# Patient Record
Sex: Female | Born: 1985 | Hispanic: No | Marital: Single | Smoking: Former smoker
Health system: Southern US, Community
[De-identification: ages and names within clinical notes are randomized; demographics above are authoritative.]

## PROBLEM LIST (undated history)

## (undated) DIAGNOSIS — K219 Gastro-esophageal reflux disease without esophagitis: Secondary | ICD-10-CM

## (undated) DIAGNOSIS — R519 Headache, unspecified: Secondary | ICD-10-CM

## (undated) DIAGNOSIS — A749 Chlamydial infection, unspecified: Secondary | ICD-10-CM

## (undated) DIAGNOSIS — F909 Attention-deficit hyperactivity disorder, unspecified type: Secondary | ICD-10-CM

## (undated) DIAGNOSIS — N939 Abnormal uterine and vaginal bleeding, unspecified: Secondary | ICD-10-CM

## (undated) DIAGNOSIS — IMO0002 Reserved for concepts with insufficient information to code with codable children: Secondary | ICD-10-CM

## (undated) DIAGNOSIS — J189 Pneumonia, unspecified organism: Secondary | ICD-10-CM

## (undated) DIAGNOSIS — F419 Anxiety disorder, unspecified: Secondary | ICD-10-CM

## (undated) DIAGNOSIS — R87619 Unspecified abnormal cytological findings in specimens from cervix uteri: Secondary | ICD-10-CM

## (undated) DIAGNOSIS — F32A Depression, unspecified: Secondary | ICD-10-CM

## (undated) HISTORY — DX: Abnormal uterine and vaginal bleeding, unspecified: N93.9

## (undated) HISTORY — DX: Reserved for concepts with insufficient information to code with codable children: IMO0002

## (undated) HISTORY — DX: Chlamydial infection, unspecified: A74.9

## (undated) HISTORY — DX: Unspecified abnormal cytological findings in specimens from cervix uteri: R87.619

## (undated) HISTORY — PX: VSD REPAIR: SHX276

---

## 2005-08-14 ENCOUNTER — Ambulatory Visit: Payer: Self-pay | Admitting: Gynecology

## 2005-11-26 ENCOUNTER — Ambulatory Visit: Payer: Self-pay | Admitting: Obstetrics & Gynecology

## 2005-12-04 ENCOUNTER — Ambulatory Visit: Payer: Self-pay | Admitting: Family Medicine

## 2005-12-22 ENCOUNTER — Encounter (INDEPENDENT_AMBULATORY_CARE_PROVIDER_SITE_OTHER): Payer: Self-pay | Admitting: *Deleted

## 2005-12-22 ENCOUNTER — Ambulatory Visit: Payer: Self-pay | Admitting: Obstetrics & Gynecology

## 2005-12-22 ENCOUNTER — Ambulatory Visit: Payer: Self-pay | Admitting: Family Medicine

## 2006-01-13 ENCOUNTER — Ambulatory Visit: Payer: Self-pay | Admitting: Gynecology

## 2006-01-19 ENCOUNTER — Ambulatory Visit: Payer: Self-pay | Admitting: Family Medicine

## 2006-02-09 ENCOUNTER — Ambulatory Visit: Payer: Self-pay | Admitting: Family Medicine

## 2006-02-16 ENCOUNTER — Ambulatory Visit: Payer: Self-pay | Admitting: Obstetrics & Gynecology

## 2006-02-24 ENCOUNTER — Ambulatory Visit (HOSPITAL_COMMUNITY): Admission: RE | Admit: 2006-02-24 | Discharge: 2006-02-24 | Payer: Self-pay | Admitting: Gynecology

## 2006-03-02 ENCOUNTER — Ambulatory Visit (HOSPITAL_COMMUNITY): Admission: RE | Admit: 2006-03-02 | Discharge: 2006-03-02 | Payer: Self-pay | Admitting: Gynecology

## 2006-03-22 ENCOUNTER — Ambulatory Visit: Payer: Self-pay | Admitting: Gynecology

## 2006-04-28 ENCOUNTER — Ambulatory Visit: Payer: Self-pay | Admitting: Obstetrics & Gynecology

## 2006-05-12 ENCOUNTER — Ambulatory Visit: Payer: Self-pay | Admitting: Obstetrics & Gynecology

## 2006-06-09 ENCOUNTER — Ambulatory Visit: Payer: Self-pay | Admitting: Obstetrics & Gynecology

## 2006-06-12 ENCOUNTER — Ambulatory Visit: Payer: Self-pay | Admitting: Family Medicine

## 2006-06-12 ENCOUNTER — Inpatient Hospital Stay (HOSPITAL_COMMUNITY): Admission: AD | Admit: 2006-06-12 | Discharge: 2006-06-12 | Payer: Self-pay | Admitting: Family Medicine

## 2006-06-30 ENCOUNTER — Ambulatory Visit: Payer: Self-pay | Admitting: Obstetrics & Gynecology

## 2006-07-01 ENCOUNTER — Ambulatory Visit: Payer: Self-pay | Admitting: Obstetrics & Gynecology

## 2006-07-02 ENCOUNTER — Ambulatory Visit: Payer: Self-pay | Admitting: Obstetrics & Gynecology

## 2006-07-07 ENCOUNTER — Ambulatory Visit: Payer: Self-pay | Admitting: Obstetrics & Gynecology

## 2006-07-12 ENCOUNTER — Inpatient Hospital Stay (HOSPITAL_COMMUNITY): Admission: AD | Admit: 2006-07-12 | Discharge: 2006-07-12 | Payer: Self-pay | Admitting: Obstetrics & Gynecology

## 2006-07-12 ENCOUNTER — Ambulatory Visit: Payer: Self-pay | Admitting: Family Medicine

## 2006-07-14 ENCOUNTER — Ambulatory Visit: Payer: Self-pay | Admitting: Obstetrics & Gynecology

## 2006-07-15 ENCOUNTER — Ambulatory Visit: Payer: Self-pay | Admitting: Obstetrics & Gynecology

## 2006-07-21 ENCOUNTER — Ambulatory Visit: Payer: Self-pay | Admitting: Obstetrics & Gynecology

## 2006-07-26 ENCOUNTER — Inpatient Hospital Stay (HOSPITAL_COMMUNITY): Admission: AD | Admit: 2006-07-26 | Discharge: 2006-07-28 | Payer: Self-pay | Admitting: Obstetrics and Gynecology

## 2006-07-26 ENCOUNTER — Ambulatory Visit: Payer: Self-pay | Admitting: Obstetrics & Gynecology

## 2006-09-06 ENCOUNTER — Encounter (INDEPENDENT_AMBULATORY_CARE_PROVIDER_SITE_OTHER): Payer: Self-pay | Admitting: Gynecology

## 2006-09-06 ENCOUNTER — Ambulatory Visit: Payer: Self-pay | Admitting: Gynecology

## 2007-04-12 ENCOUNTER — Ambulatory Visit: Payer: Self-pay | Admitting: Gynecology

## 2007-08-10 ENCOUNTER — Ambulatory Visit: Payer: Self-pay | Admitting: Gynecology

## 2007-08-24 ENCOUNTER — Ambulatory Visit (HOSPITAL_COMMUNITY): Admission: RE | Admit: 2007-08-24 | Discharge: 2007-08-24 | Payer: Self-pay | Admitting: Gynecology

## 2007-09-08 ENCOUNTER — Encounter (INDEPENDENT_AMBULATORY_CARE_PROVIDER_SITE_OTHER): Payer: Self-pay | Admitting: Gynecology

## 2007-09-08 ENCOUNTER — Ambulatory Visit: Payer: Self-pay | Admitting: Gynecology

## 2007-10-10 ENCOUNTER — Ambulatory Visit: Payer: Self-pay | Admitting: Gynecology

## 2007-11-07 ENCOUNTER — Ambulatory Visit (HOSPITAL_COMMUNITY): Admission: RE | Admit: 2007-11-07 | Discharge: 2007-11-07 | Payer: Self-pay | Admitting: Gynecology

## 2007-11-14 ENCOUNTER — Ambulatory Visit: Payer: Self-pay | Admitting: Gynecology

## 2007-11-21 IMAGING — US US OB COMP +14 WK
1 series · 13 of 28 positions shown · non-contrast
Comparison: none

CLINICAL DATA: 19 week 1 day gestational age by LMP.  Evaluate dating and anatomy.

[Series 1: us ob comp +14 wk · 13 of 74 slices shown]
[im 3/74]
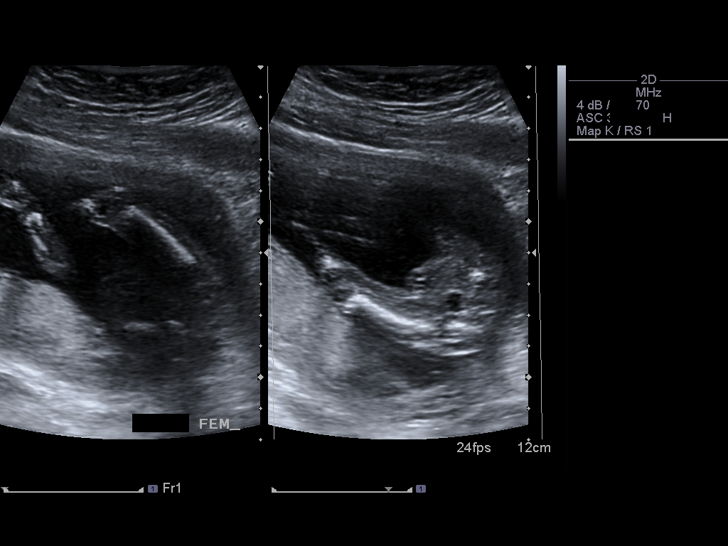
[im 9/74]
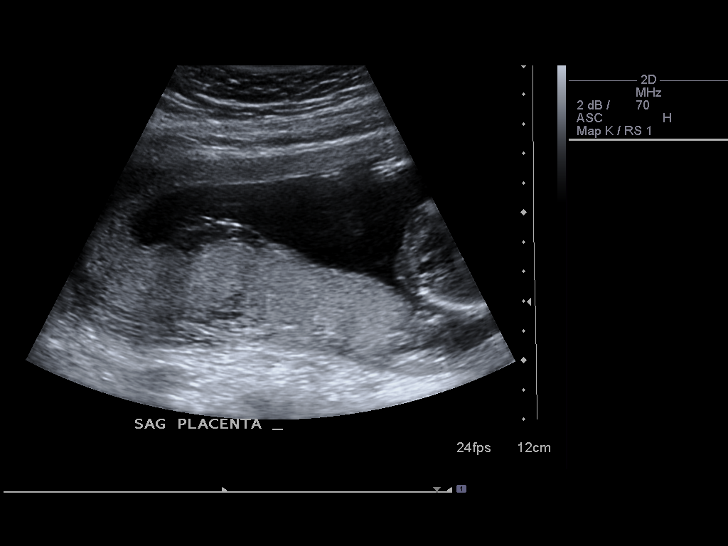
[im 14/74]
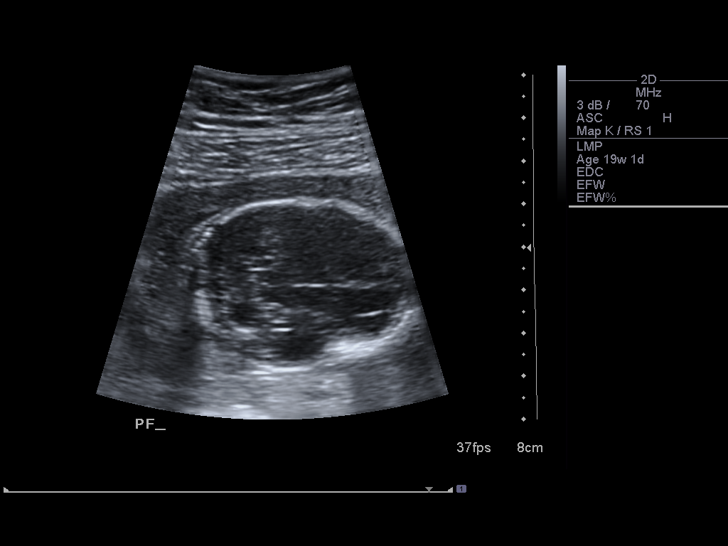
[im 19/74]
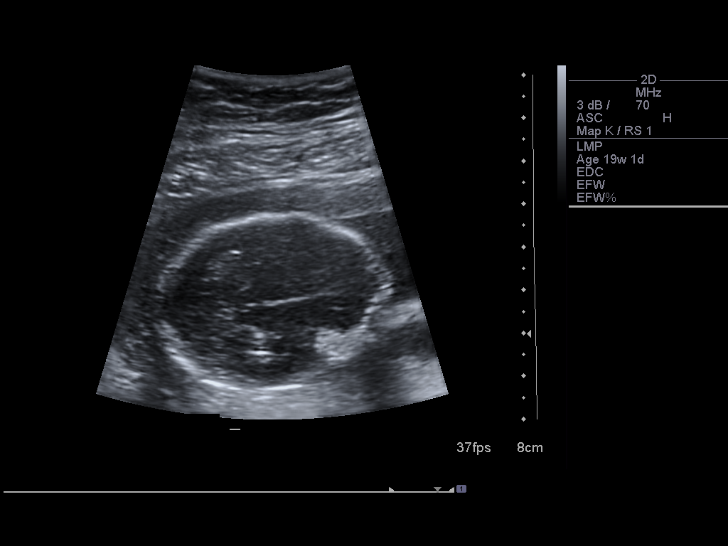
[im 25/74]
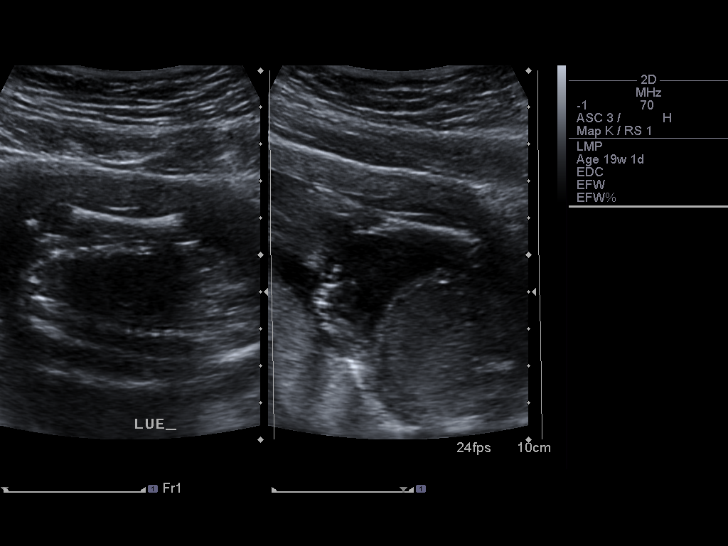
[im 30/74]
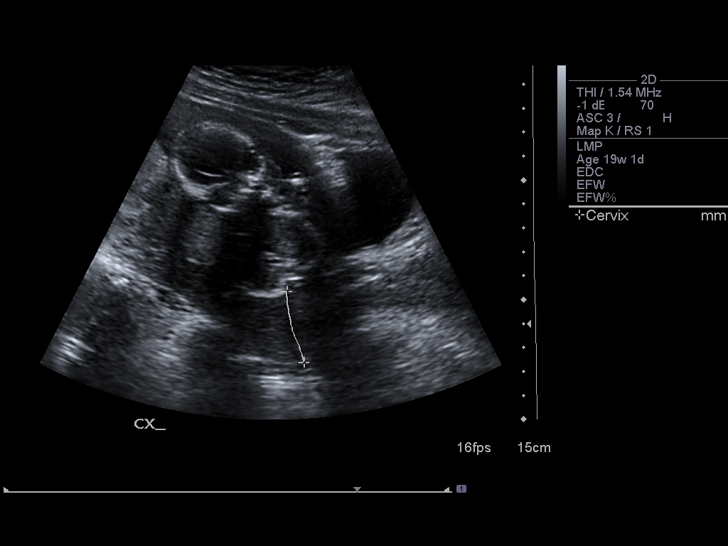
[im 38/74]
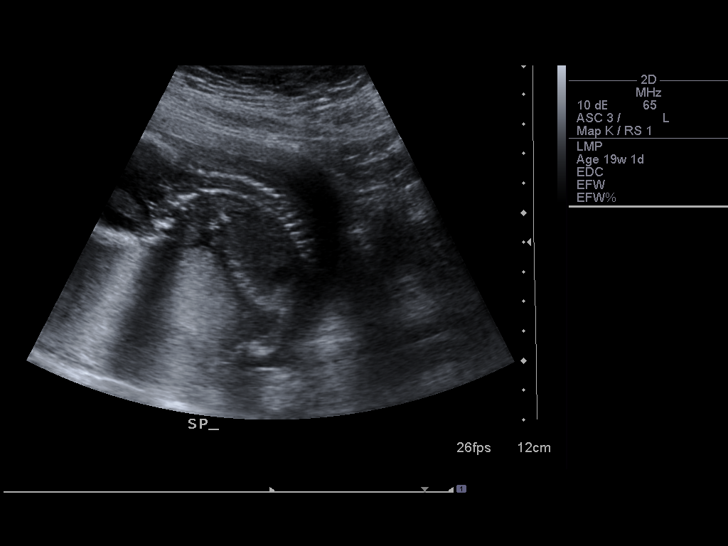
[im 44/74]
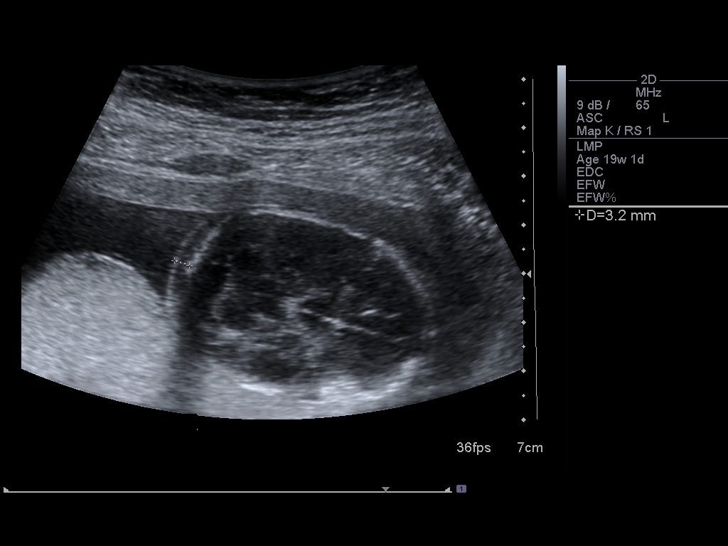
[im 49/74]
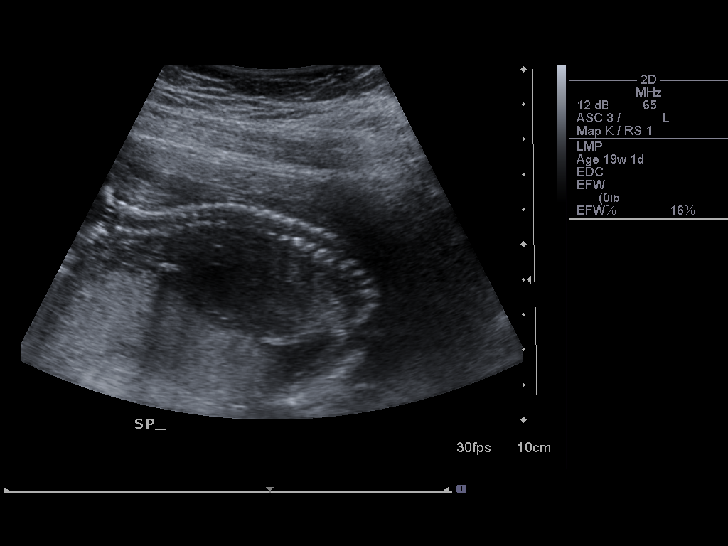
[im 55/74]
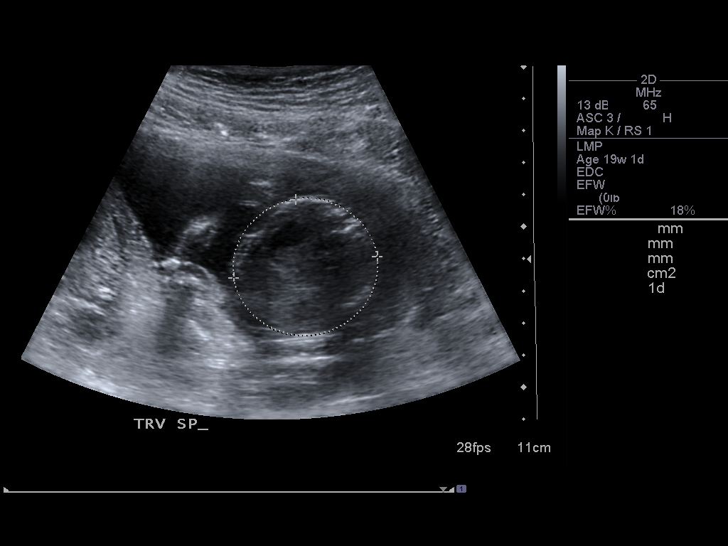
[im 60/74]
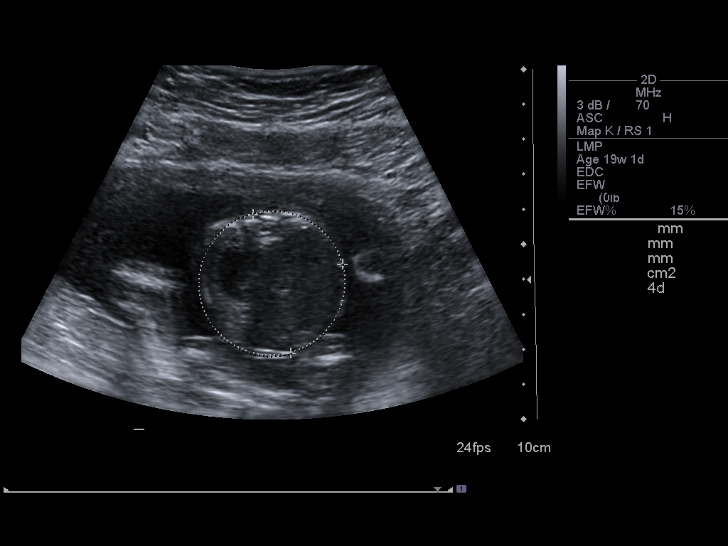
[im 65/74]
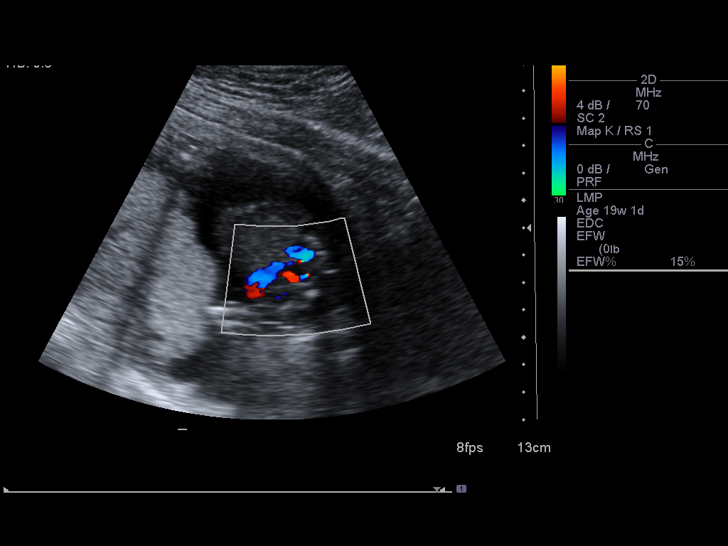
[im 71/74]
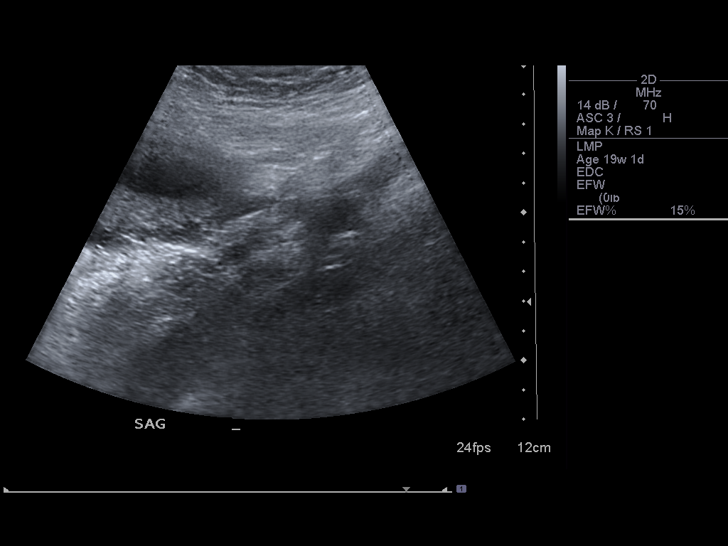

[13 of 28 positions shown; findings below may reference images not displayed]

OBSTETRICAL ULTRASOUND:
 Number of Fetuses:  1
 Heart Rate:  136 bpm
 Movement:  Yes
 Breathing:  No
 Presentation:  Variable
 Placental Location:  Posterior
 Grade:  I
 Previa:  No
 Amniotic Fluid (Subjective):  Normal
 Amniotic Fluid (Objective):  4.5 cm vertical pocket 

 FETAL BIOMETRY
 BPD:  4.0 cm   18 w 1 d 
 HC:  15.2 cm   18 w 2 d 
 AC:  13.4 cm   18 w 6 d 
 FL:  2.7 cm   18 w 1 d 

 MEAN GA:   18 w 3 d   US EDC:  07/25/06

 FETAL ANATOMY
 Lateral Ventricles:  Visualized 
 Thalami/CSP:  Visualized 
 Posterior Fossa:  Visualized 
 Nuchal Region:  NF= 3.2 mm  Visualized 
 Spine:  Not visualized 
 4 Chamber Heart on Left:  Not visualized 
 Stomach on Left:  Visualized 
 3 Vessel Cord:  Visualized 
 Cord Insertion site:  Visualized 
 Kidneys:  Visualized 
 Bladder:  Visualized 
 Extremities:  Visualized 

 ADDITIONAL ANATOMY VISUALIZED:  Upper lip, orbits, diaphragm, heel, ductal arch, and aortic arch.

 Evaluation limited by:  Fetal position.

 MATERNAL UTERINE AND ADNEXAL FINDINGS
 Cervix:  3.1 cm transabdominal.
IMPRESSION: 1.  Single living intrauterine fetus with mean gestational age of 18 weeks 3 days and US EDC of 07/25/06.  This is concordant with LMP.
 2.  Incomplete fetal anatomic evaluation due to fetal position.  Visualized fetal anatomy is unremarkable.  Follow-up anatomy evaluation has been scheduled for patient convenience on 03/02/06.

## 2007-12-12 ENCOUNTER — Ambulatory Visit: Payer: Self-pay | Admitting: Gynecology

## 2008-01-11 ENCOUNTER — Encounter (INDEPENDENT_AMBULATORY_CARE_PROVIDER_SITE_OTHER): Payer: Self-pay | Admitting: Gynecology

## 2008-01-11 ENCOUNTER — Ambulatory Visit: Payer: Self-pay | Admitting: Obstetrics and Gynecology

## 2008-01-11 LAB — CONVERTED CEMR LAB
HCT: 36.5 % (ref 36.0–46.0)
MCV: 85.5 fL (ref 78.0–100.0)
RBC: 4.27 M/uL (ref 3.87–5.11)
WBC: 11.1 10*3/uL — ABNORMAL HIGH (ref 4.0–10.5)

## 2008-02-15 ENCOUNTER — Ambulatory Visit: Payer: Self-pay | Admitting: Obstetrics and Gynecology

## 2008-03-01 ENCOUNTER — Ambulatory Visit: Payer: Self-pay | Admitting: Family Medicine

## 2008-03-08 ENCOUNTER — Encounter (INDEPENDENT_AMBULATORY_CARE_PROVIDER_SITE_OTHER): Payer: Self-pay | Admitting: Gynecology

## 2008-03-08 LAB — CONVERTED CEMR LAB
Hgb A2 Quant: 2.5 % (ref 2.2–3.2)
Hgb A: 97.5 % (ref 96.8–97.8)
Hgb F Quant: 0 % (ref 0.0–2.0)
Hgb S Quant: 0 % (ref 0.0–0.0)
RBC Folate: 680 ng/mL — ABNORMAL HIGH (ref 180–600)

## 2008-03-20 ENCOUNTER — Ambulatory Visit: Payer: Self-pay | Admitting: Obstetrics and Gynecology

## 2008-03-20 ENCOUNTER — Ambulatory Visit (HOSPITAL_COMMUNITY): Admission: RE | Admit: 2008-03-20 | Discharge: 2008-03-20 | Payer: Self-pay | Admitting: Obstetrics & Gynecology

## 2008-03-26 ENCOUNTER — Ambulatory Visit: Payer: Self-pay | Admitting: Obstetrics and Gynecology

## 2008-03-26 ENCOUNTER — Encounter: Payer: Self-pay | Admitting: Family Medicine

## 2008-03-27 ENCOUNTER — Encounter: Payer: Self-pay | Admitting: Family Medicine

## 2008-04-02 ENCOUNTER — Ambulatory Visit: Payer: Self-pay | Admitting: Obstetrics and Gynecology

## 2008-04-05 ENCOUNTER — Ambulatory Visit: Payer: Self-pay | Admitting: Obstetrics & Gynecology

## 2008-04-05 ENCOUNTER — Inpatient Hospital Stay (HOSPITAL_COMMUNITY): Admission: AD | Admit: 2008-04-05 | Discharge: 2008-04-08 | Payer: Self-pay | Admitting: Obstetrics & Gynecology

## 2008-04-06 ENCOUNTER — Ambulatory Visit: Payer: Self-pay | Admitting: Advanced Practice Midwife

## 2008-05-17 ENCOUNTER — Encounter: Payer: Self-pay | Admitting: Obstetrics and Gynecology

## 2008-05-17 ENCOUNTER — Other Ambulatory Visit: Admission: RE | Admit: 2008-05-17 | Discharge: 2008-05-17 | Payer: Self-pay | Admitting: Obstetrics and Gynecology

## 2008-05-17 ENCOUNTER — Ambulatory Visit: Payer: Self-pay | Admitting: Obstetrics and Gynecology

## 2008-05-31 ENCOUNTER — Ambulatory Visit: Payer: Self-pay | Admitting: Obstetrics and Gynecology

## 2009-02-01 ENCOUNTER — Encounter: Payer: Self-pay | Admitting: Family Medicine

## 2009-05-20 IMAGING — US US OB COMP LESS 14 WK
1 series · 14 of 28 positions shown · non-contrast
Comparison: none

OBSTETRICAL ULTRASOUND:
 This ultrasound exam was performed in the [HOSPITAL] Ultrasound Department.  The OB US report was generated in the AS system, and faxed to the ordering physician.  This report is also available in [REDACTED] PACS.

[Series 1: us ob comp less 14 wks · 14 of 52 slices shown]
[im 2/52]
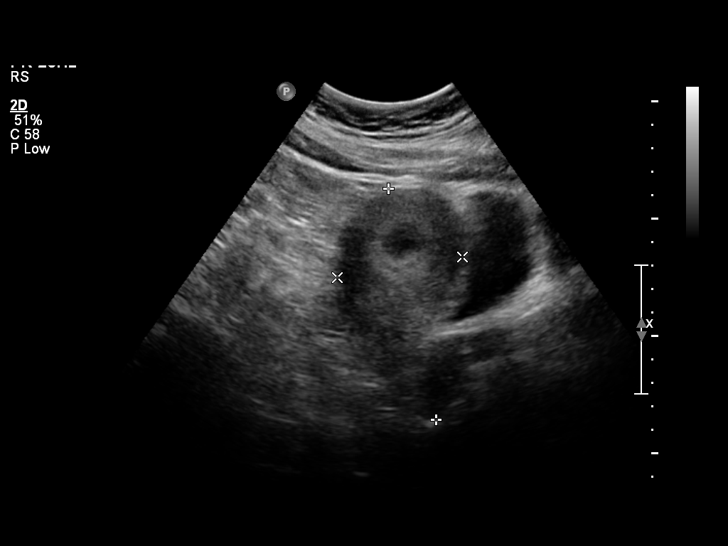
[im 6/52]
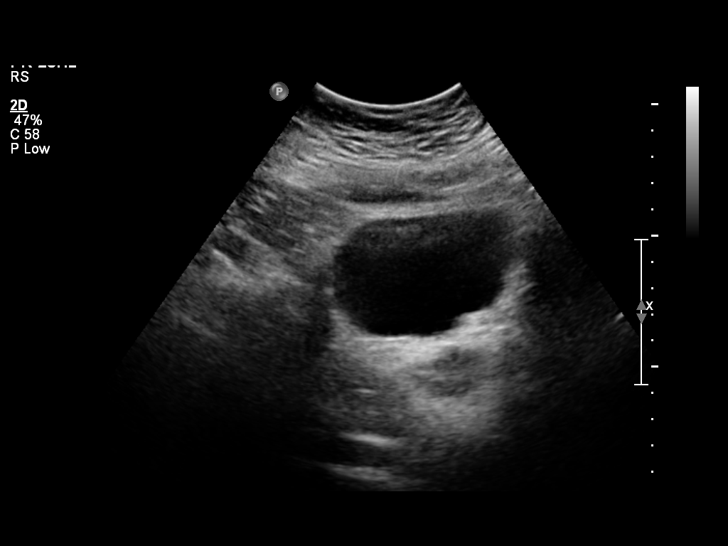
[im 10/52]
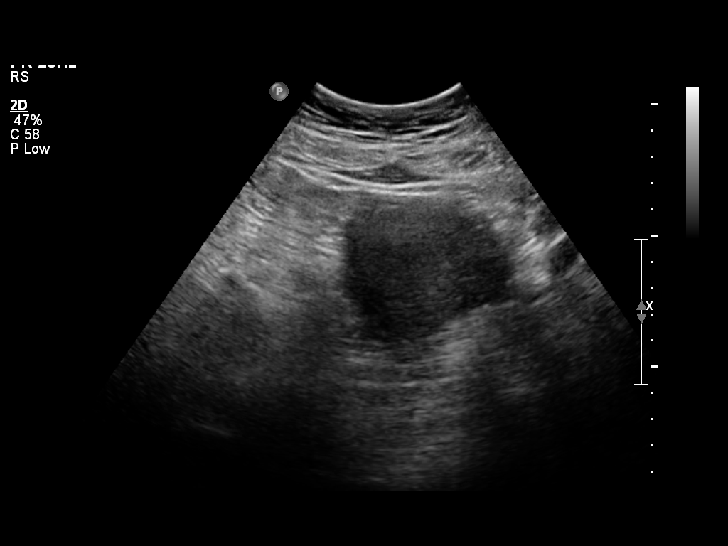
[im 14/52]
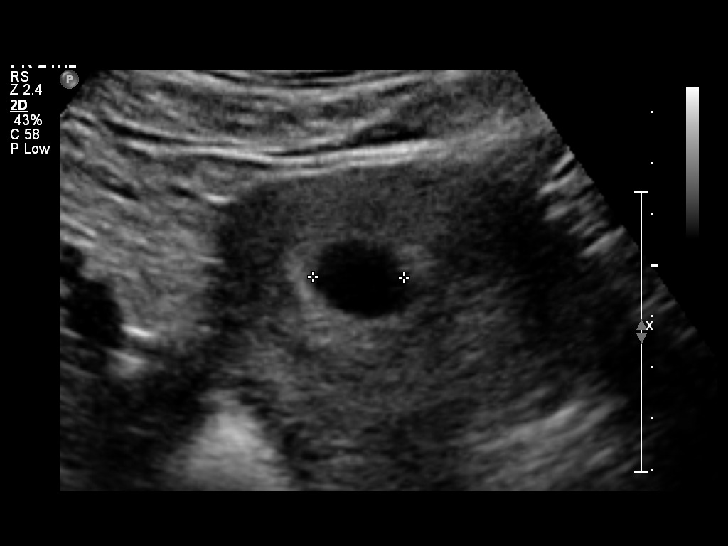
[im 18/52]
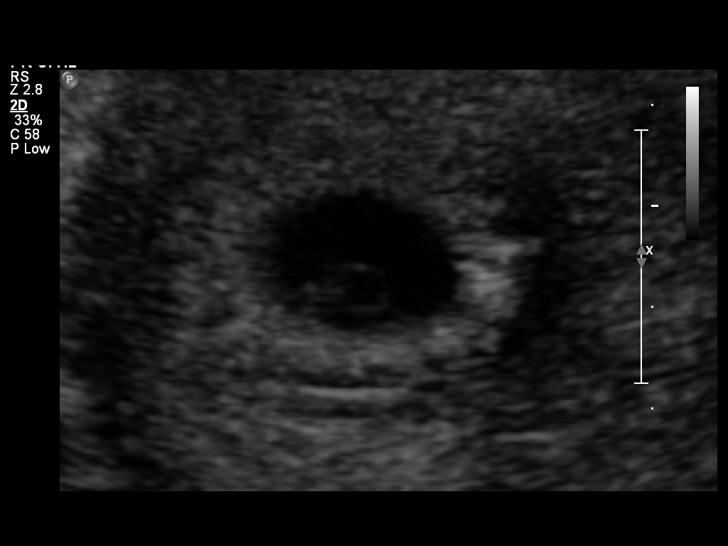
[im 21/52]
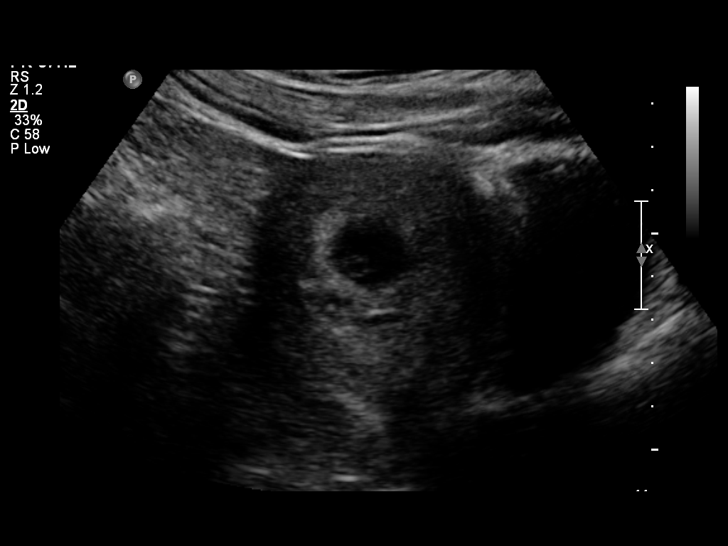
[im 25/52]
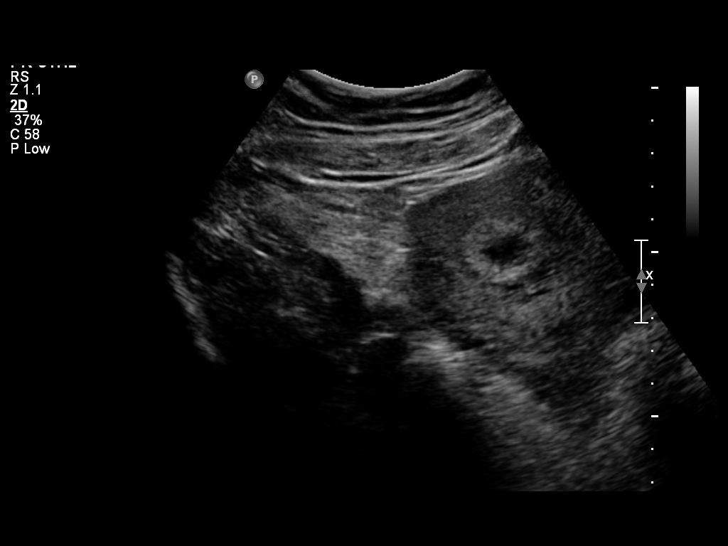
[im 29/52]
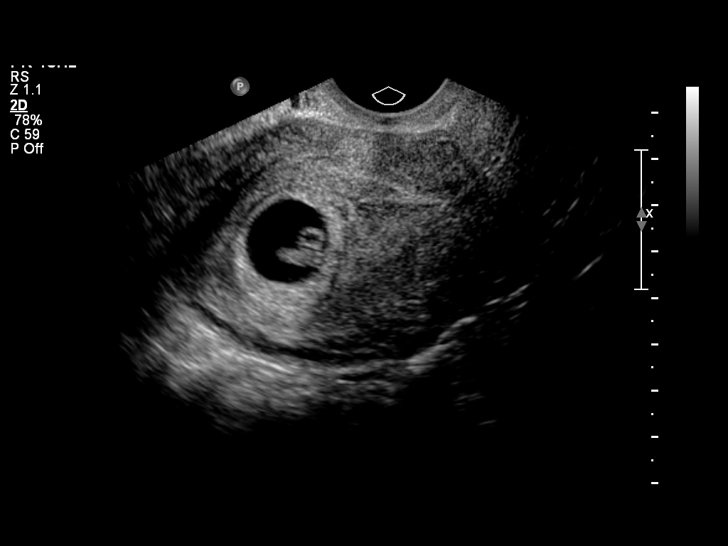
[im 33/52]
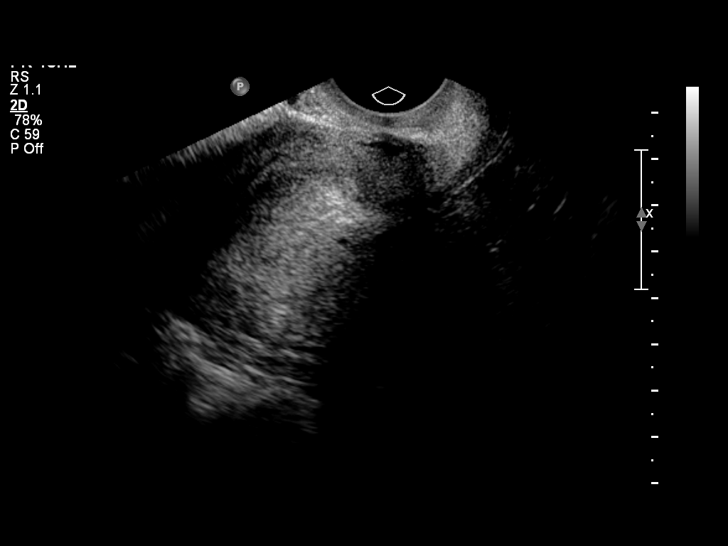
[im 36/52]
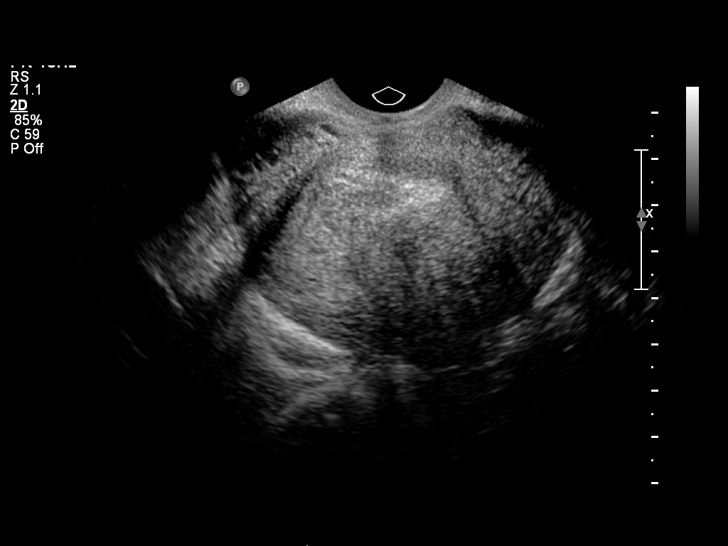
[im 40/52]
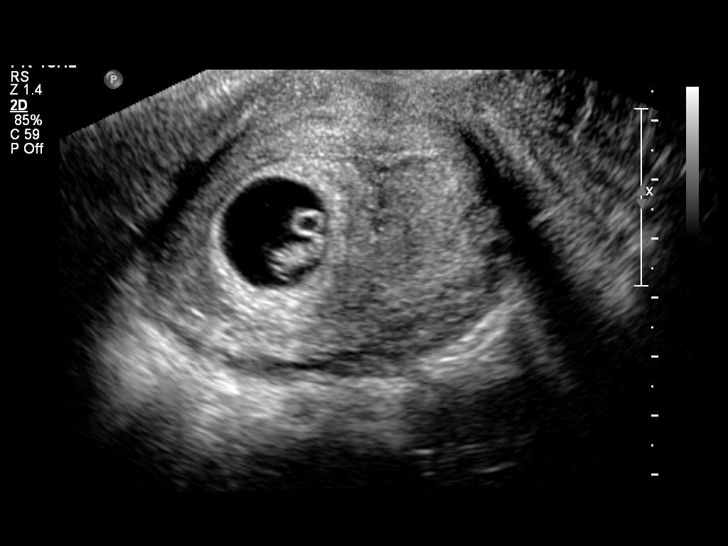
[im 44/52]
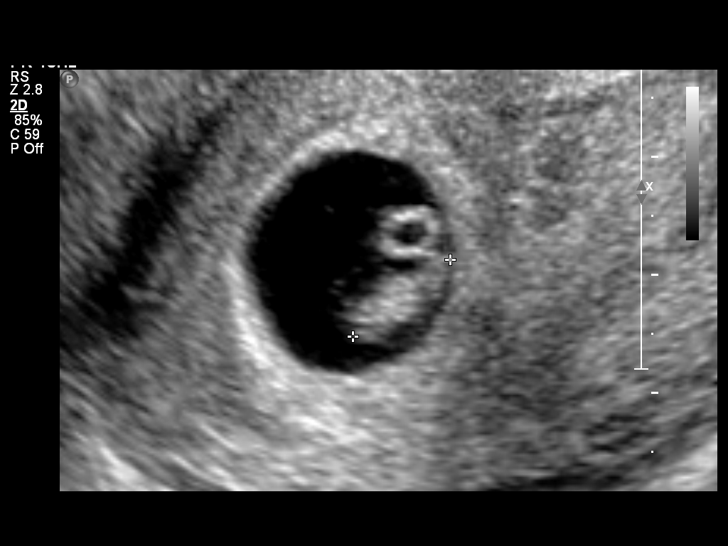
[im 48/52]
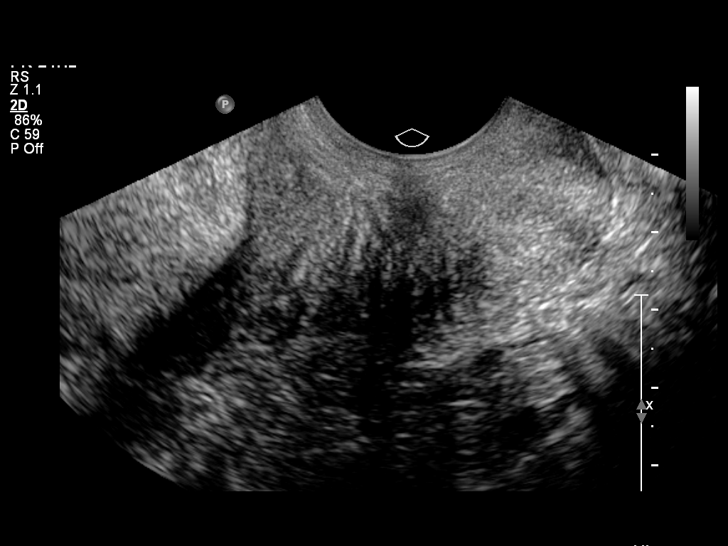
[im 52/52]
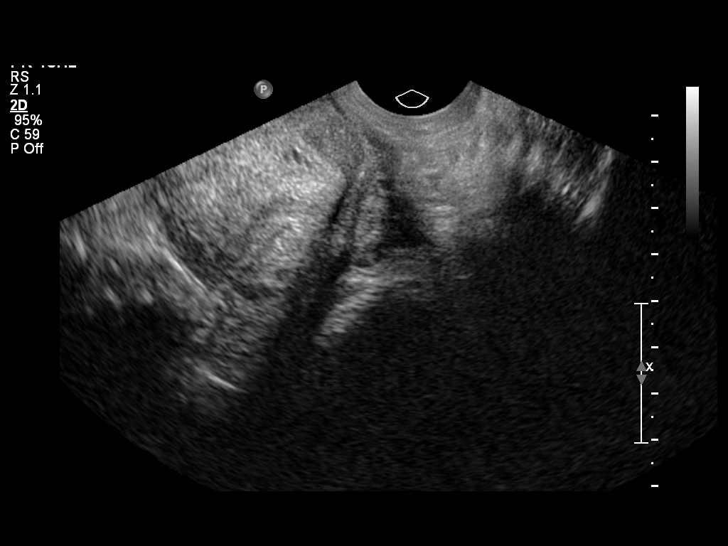

[14 of 28 positions shown; findings below may reference images not displayed]

IMPRESSION: See AS Obstetric US report.

## 2009-12-31 ENCOUNTER — Ambulatory Visit: Payer: Self-pay | Admitting: Family Medicine

## 2010-03-04 ENCOUNTER — Ambulatory Visit: Payer: Self-pay | Admitting: Family Medicine

## 2010-03-25 ENCOUNTER — Ambulatory Visit: Admit: 2010-03-25 | Payer: Self-pay | Admitting: Family Medicine

## 2010-04-05 ENCOUNTER — Encounter: Payer: Self-pay | Admitting: Obstetrics & Gynecology

## 2010-05-06 ENCOUNTER — Encounter: Payer: PRIVATE HEALTH INSURANCE | Admitting: Obstetrics and Gynecology

## 2010-05-06 DIAGNOSIS — R87613 High grade squamous intraepithelial lesion on cytologic smear of cervix (HGSIL): Secondary | ICD-10-CM

## 2010-05-06 DIAGNOSIS — Z113 Encounter for screening for infections with a predominantly sexual mode of transmission: Secondary | ICD-10-CM

## 2010-05-06 HISTORY — PX: COLPOSCOPY: SHX161

## 2010-05-20 ENCOUNTER — Ambulatory Visit: Payer: PRIVATE HEALTH INSURANCE | Admitting: Obstetrics and Gynecology

## 2010-06-03 ENCOUNTER — Encounter: Payer: PRIVATE HEALTH INSURANCE | Admitting: Obstetrics and Gynecology

## 2010-06-30 LAB — CBC
HCT: 34.8 % — ABNORMAL LOW (ref 36.0–46.0)
Hemoglobin: 11.5 g/dL — ABNORMAL LOW (ref 12.0–15.0)
MCHC: 33.1 g/dL (ref 30.0–36.0)
MCHC: 33.3 g/dL (ref 30.0–36.0)
MCV: 81.3 fL (ref 78.0–100.0)
Platelets: 232 10*3/uL (ref 150–400)
RDW: 15.4 % (ref 11.5–15.5)

## 2010-06-30 LAB — CCBB MATERNAL DONOR DRAW

## 2010-07-29 NOTE — Assessment & Plan Note (Signed)
NAME:  Sydney West, Sydney West NO.:  [id number]   MEDICAL RECORD NO.:  [id number]          PATIENT TYPE:  POB   LOCATION:  [facility] at [facility]         FACILITY:  [facility]   PHYSICIAN:  Tinnie Gens, MD        DATE OF BIRTH:  09/05/1985   DATE OF SERVICE:  12/31/2009                                  CLINIC NOTE   CHIEF COMPLAINT:  Physical exam.   HISTORY OF PRESENT ILLNESS:  The patient is a 25 year old, gravida 2,  para 2 who has a Mirena in for birth control.  She has had some issues  with that and that she has had bleeding that has occurred up to 3 times  in a month over the last little bit.  She has had in for approximately 2  years now and initially had some irregular bleeding, which finally  shaped up.  She had a regular monthly cycle, but now gone back to having  irregular cycles.  She has lost a good bit of weight.  Her weight was up  at 256 at the end of the pregnancy and she is down to 201 today.   PAST MEDICAL HISTORY:  Significant for asthma.   PAST SURGICAL HISTORY:  She had a septal repair for broken nose as a  kid.   OBSTETRICAL HISTORY:  She is a G2, P2 with 2 vaginal deliveries.   GYNECOLOGIC HISTORY:  She has a history of Chlamydia.  No history of  abnormal Pap.   MEDICATIONS:  She is on Mirena IUD and Advair inhaler p.r.n.   ALLERGIES:  None known.   FAMILY HISTORY:  Maternal grandmother just diagnosed with breast cancer  at age 17, maternal grandfather is deceased of lung cancer at age 3,  and a strong family history of diabetes.   SOCIAL HISTORY:  The patient works for orthopedist.  She is a smoker of  approximately one half pack per day.  She is trying to quit.  She is a  social alcohol user approximately twice monthly and denies any other  drug use.   REVIEW OF SYSTEMS:  Fourteen point review of systems are reviewed.  She  denies headache, vision changes, hearing loss, nausea, vomiting,  diarrhea, constipation, abdominal pain, chest pain,  shortness of breath,  dysuria, blood in her stool, blood in her urine, vaginal discharge.   PHYSICAL EXAMINATION:  VITAL SIGNS:  Are as noted in the chart.  Blood  pressure 119/63.  GENERAL:  She is a well-developed, well-nourished female, in no acute  distress.  HEENT:  Normocephalic, atraumatic.  Sclerae anicteric.  NECK:  Supple.  Normal thyroid.  LUNGS:  Clear bilaterally.  CV:  Regular rate and rhythm without gallops, murmurs, or rubs.  ABDOMEN:  Soft, nontender, nondistended.  EXTREMITIES:  No cyanosis, clubbing, or edema.  Distal pulses 2+.  BREASTS:  Symmetric with everted nipples.  No masses.  No  supraclavicular or axillary adenopathy.  GU:  Normal external female genitalia.  BUS is normal.  Vagina is pink  and rugated.  The cervix is parous without lesions.  Uterus is small and  anteverted.  No adnexal mass or tenderness.   IMPRESSION:  1. Gynecologic exam with Pap smear.  2. Undesired fertility with Mirena in place.   PLAN:  1. Continue Mirena IUD, discussed options.  The patient is      noncompliant with OCs Implanon as well as trying other options      because of abnormal bleeding associated with it as well.  She is      unsure of the Depo-Provera, so for now we will continue the IUD      unless if this is not working for her.  2. Pap smear today.  Follow up in 1 year.           ______________________________  Tinnie Gens, MD     TP/MEDQ  D:  12/31/2009  T:  01/01/2010  Job:  [id number]

## 2010-07-31 ENCOUNTER — Ambulatory Visit (INDEPENDENT_AMBULATORY_CARE_PROVIDER_SITE_OTHER): Payer: PRIVATE HEALTH INSURANCE | Admitting: Obstetrics and Gynecology

## 2010-07-31 DIAGNOSIS — R87612 Low grade squamous intraepithelial lesion on cytologic smear of cervix (LGSIL): Secondary | ICD-10-CM

## 2010-09-25 NOTE — Assessment & Plan Note (Signed)
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  The patient was counseled for repeat Pap smear in 6 months versus cryotherapy.  The patient opted for cryotherapy.  The patient presents today for that purpose.  After informed consent was obtained, the patient was placed in dorsal lithotomy position and cryotherapy was initiated in 2 cycles of 3 minutes, each using a head with a shallow endocervical component.  The patient tolerated the procedure well and will return in 6 months for repeat Pap smear.          ______________________________ Catalina Antigua, MD    PC/MEDQ  D:  07/31/2010  T:  08/01/2010  Job:  [id number]

## 2010-10-13 ENCOUNTER — Telehealth: Payer: Self-pay

## 2010-10-13 NOTE — Telephone Encounter (Signed)
Xanax .5mg  up to bid was the last dose she received in 2008 she was given 60 with no refills and she said they have lasted her until recently.  She takes them very rarely.

## 2010-10-13 NOTE — Telephone Encounter (Signed)
PATIENT NEEDS A REFILL ON HER ZANEX. SHE LAST GOT ONE IN 2008. PLEASE CALL IT IN AT [facility] ON [address].

## 2010-10-13 NOTE — Telephone Encounter (Signed)
PATIENTS NEEDS A REFILL ON HER MEDICATION SHE SAID WE HAVE NOT FILLED IT IN A WHILE. SHE NEEDS

## 2010-10-14 ENCOUNTER — Other Ambulatory Visit: Payer: Self-pay | Admitting: *Deleted

## 2010-10-14 DIAGNOSIS — F411 Generalized anxiety disorder: Secondary | ICD-10-CM

## 2010-10-14 MED ORDER — ALPRAZOLAM 0.5 MG PO TABS: 0.5000 mg | ORAL_TABLET | Freq: Two times a day (BID) | ORAL | Status: DC | PRN

## 2010-10-14 NOTE — Telephone Encounter (Signed)
RX was faxed manually to patients pharmacy, due to had to be signed by Dr.

## 2010-10-14 NOTE — Telephone Encounter (Signed)
Dr. Shawnie Pons authorized one refill of xanax .5mg .  #60 with no refills. Take one po bid prn.

## 2011-04-20 ENCOUNTER — Ambulatory Visit (INDEPENDENT_AMBULATORY_CARE_PROVIDER_SITE_OTHER): Payer: PRIVATE HEALTH INSURANCE | Admitting: Obstetrics & Gynecology

## 2011-04-20 ENCOUNTER — Encounter: Payer: Self-pay | Admitting: Obstetrics & Gynecology

## 2011-04-20 DIAGNOSIS — N870 Mild cervical dysplasia: Secondary | ICD-10-CM

## 2011-04-20 DIAGNOSIS — Z975 Presence of (intrauterine) contraceptive device: Secondary | ICD-10-CM

## 2011-04-20 DIAGNOSIS — Z1272 Encounter for screening for malignant neoplasm of vagina: Secondary | ICD-10-CM

## 2011-04-20 DIAGNOSIS — Z113 Encounter for screening for infections with a predominantly sexual mode of transmission: Secondary | ICD-10-CM

## 2011-04-20 DIAGNOSIS — Z01419 Encounter for gynecological examination (general) (routine) without abnormal findings: Secondary | ICD-10-CM

## 2011-04-20 NOTE — Progress Notes (Signed)
Patient is here today for follow up pap and yearly exam.

## 2011-04-20 NOTE — Progress Notes (Signed)
  Subjective:     Sydney West is a 26 y.o. female here for a routine exam.  Current complaints: none. Smokes 2-3 cigarettes a day.  Had colposcopy for LSIL pap in 04/2010, showed CINI. Missed follow up pap smear.   Gynecologic History Patient's last menstrual period was 04/01/2011. Contraception: IUD, Mirena placed 2010   Obstetric History OB History    Grav Para Term Preterm Abortions TAB SAB Ect Mult Living   2 2             # Outc Date GA Lbr Len/2nd Wgt Sex Del Anes PTL Lv   1 PAR            2 PAR                The following portions of the patient's history were reviewed and updated as appropriate: allergies, current medications, past family history, past medical history, past social history, past surgical history and problem list.  Review of Systems A comprehensive review of systems was negative.    Objective:    Blood pressure 118/74, pulse 69, height 5\' 9"  (1.753 m), weight 204 lb (92.534 kg), last menstrual period 04/01/2011. GENERAL: Well-developed, well-nourished female in no acute distress.  HEENT: Normocephalic, atraumatic. Sclerae anicteric.  NECK: Supple. Normal thyroid.  LUNGS: Clear to auscultation bilaterally.  HEART: Regular rate and rhythm. BREASTS: Symmetric with everted nipples. No masses, skin changes, nipple drainage, or lymphadenopathy. ABDOMEN: Soft, nontender, nondistended. No organomegaly. PELVIC: Normal external female genitalia. Vagina is pink and rugated.  Normal discharge. Normal cervix contour, IUD strings seen. Pap smear obtained. Uterus is normal in size. No adnexal mass or tenderness.  EXTREMITIES: No cyanosis, clubbing, or edema, 2+ distal pulses.     Assessment:    Healthy female exam.  CIN I   Plan:    Education reviewed: smoking cessation. Follow up pap.  For her cervical dysplasia, patient needs pap smears every six months until she has two consecutive normal pap smears, then she can resume annual screening. If any pap smear is  abnormal, she will need repeat colposcopy and appropriate evaluation.

## 2011-04-20 NOTE — Patient Instructions (Signed)
Cervical Dysplasia Cervical dysplasia is a condition in which a woman has abnormal changes in the cells of her cervix. The cervix is the opening to the uterus (womb) between the vagina and the uterus. These changes are called cervical dysplasia and may be the first signs of cervical cancer. These cells can be taken from the cervix during a Pap test and then looked at under a microscope. With early detection, treatment, and close follow-up care, nearly all cervical dysplasia can be cured. If untreated, the mild to moderate stages of dysplasia often grow more severe.  RISK FACTORS  The following increase the risk for cervical dysplasia.  Having had a sexually transmitted disease, including:   Chlamydia.   Human papilloma virus (HPV).   Becoming sexually active before age 18.   Having had more than 1 sexual partner.   Not using protection, such as condoms, during sexual intercourse, especially with new sexual partners.   Having had cancer of the vagina or vulva.   Having a sexual partner whose previous partner had cancer of the cervix or cervical dysplasia.   Having a sexual partner who has or has had cancer of the penis.   Having a weakened immune system (HIV, organ transplant).   Being the daughter of a woman who took DES (diethylstilbestrol) during pregnancy.   A history of cervical cancer in a woman's sister or mother.   Smoking.   Having had an abnormal Pap test in the past.  SYMPTOMS  There are usually no symptoms. If there are symptoms, they may be vague such as:  Abnormal vaginal discharge.   Bleeding between periods or following intercourse.   Bleeding during menopause.   Pain on intercourse (dyspareunia).  DIAGNOSIS   The Pap test is the best way of detecting abnormalities of the cervix.   Biopsy (removing a piece of tissue to look at under the microscope) of the cervix when the Pap test is abnormal or when the Pap test is normal, but the cervix looks abnormal.    TREATMENT  Catching and treating the changes early with Pap tests can prevent cervical cancer.  Cryotherapy freezes the abnormal cells with a steel tip instrument.   A laser can be used to remove the abnormal cells.   Loop electrocautery excision procedure (LEEP). This procedure uses a heated electrical loop to remove a cone-like portion of the cervix, including the cervical canal.   For more serious cases of cervical dysplasia, the abnormal tissue may be removed surgically by:   A cone biopsy (by cold knife, laser or LEEP). A procedure in which a portion of the center of the cervix with the cervical canal is removed.   The uterus and cervix are removed (hysterectomy).  Your caregiver will advise you regarding the need and timing of Pap tests in your follow-up. Women who have been treated for dysplasia should be closely followed with pelvic exams and Pap tests. During the first year following treatment of cervical dysplasia, Pap tests should be done every 3 to 4 months. In the second year, the schedule is every 6 months, or as recommended by your caregiver. See your caregiver for new or worsening problems. HOME CARE INSTRUCTIONS   Follow the instructions and recommendations of your caregiver regarding medicines and follow-up appointments.   Only take over-the-counter or prescription medicines for pain or discomfort as directed by your caregiver.   Cramping and pelvic discomfort may follow cryotherapy. It is not abnormal to have watery discharge for several weeks after.     Laser, cone surgery, cryotherapy or LEEP can cause a bad smelling vaginal discharge. It may also cause vaginal bleeding for a couple weeks following the procedure. The discharge may be black from the paste used to control bleeding from the cone site. This is normal.   Do not use tampons, have sexual intercourse or douche until your caregiver says it is okay.  SEEK MEDICAL CARE IF:   You develop genital warts.   You  need a prescription for pain medicine following your treatment.  SEEK IMMEDIATE MEDICAL CARE IF:   Your bleeding is heavier than a normal menstrual period.   You develop bright red bleeding, especially if you have blood clots.   You have a fever.   You have increasing cramps or pain not relieved with medicine.   You are lightheaded, unusually weak, or have fainting spells.   You have abnormal vaginal discharge.   You develop abdominal pain.  PREVENTION   The surest way to prevent cervical dysplasia is to abstain from sexual intercourse.   Practice safe sex, use condoms and have only one sex partner who does not have other sex partners.   A Pap test is done to screen for cervical cancer.   The first Pap test should be done at age 21.   Between ages 21 and 29, Pap tests are repeated every 2 years.   Beginning at age 30, you are advised to have a Pap test every 3 years as long as your past 3 Pap tests have been normal.   Some women have medical problems that increase the chance of getting cervical cancer. Talk to your caregiver about these problems. It is especially important to talk to your caregiver if a new problem develops soon after your last Pap test. In these cases, your caregiver may recommend more frequent screening and Pap tests.   The above recommendations are the same for women who have or have not gotten the vaccine for HPV (Human Papillomavirus).   If you had a hysterectomy for a problem that was not a cancer or a condition that could lead to cancer, then you no longer need Pap tests. However, even if you no longer need a Pap test, a regular exam is a good idea to make sure no other problems are starting.    If you are between ages 65 and 70, and you have had normal Pap tests going back 10 years, you no longer need Pap tests. However, even if you no longer need a Pap test, a regular exam is a good idea to make sure no other problems are starting.    If you have  had past treatment for cervical cancer or a condition that could lead to cancer, you need Pap tests and screening for cancer for at least 20 years after your treatment.   If Pap tests have been discontinued, risk factors (such as a new sexual partner) need to be re-assessed to determine if screening should be resumed.   Some women may need screenings more often if they are at high risk for cervical cancer.   Your caregiver may do additional tests including:   Colposcopy. A procedure in which a special microscope magnifies the cells and allows the provider to closely examine the cervix, vagina, and vulva.   Biopsy. A small tissue sample is taken from the cervix, vagina or vulva. This is generally done in your caregivers office.   A cone biopsy (cold knife or laser). A large tissue sample is   taken from the cervix. This procedure is usually done in an operating room under a general anesthetic. The cone often removes all abnormal tissue and so may also complete the treatment.   LEEP, also removing a circular portion of the cervix and is done in a doctors office under a local anesthetic.   Now there is a vaccine, Gardasil, that was developed to prevent the HPV'S that can cause cancer of the cervix and genital warts. It is recommended for females ages 9 to 26. It should not be given to pregnant women until more is known about its effects on the fetus. Not all cancers of the cervix are caused by the HPV. Routine gynecology exams and Pap tests should continue as recommended by your caregiver.  Document Released: 03/02/2005 Document Revised: 11/12/2010 Document Reviewed: 02/22/2008 [employer] Patient Information 2012 [employer]. 

## 2011-06-01 ENCOUNTER — Ambulatory Visit: Payer: PRIVATE HEALTH INSURANCE | Admitting: Obstetrics & Gynecology

## 2011-06-29 ENCOUNTER — Telehealth: Payer: Self-pay | Admitting: *Deleted

## 2011-06-29 MED ORDER — FLUCONAZOLE 150 MG PO TABS: 150.0000 mg | ORAL_TABLET | Freq: Once | ORAL | Status: AC

## 2011-06-29 NOTE — Telephone Encounter (Signed)
Patient developed a yeast infection over the weekend and is in need of Diflucan.  She has itching irritation and a clumpy white discharge.

## 2011-09-07 ENCOUNTER — Telehealth: Payer: Self-pay | Admitting: *Deleted

## 2011-09-07 DIAGNOSIS — F419 Anxiety disorder, unspecified: Secondary | ICD-10-CM

## 2011-09-07 MED ORDER — ALPRAZOLAM 0.5 MG PO TABS: 0.5000 mg | ORAL_TABLET | Freq: Every evening | ORAL | Status: AC | PRN

## 2011-09-07 NOTE — Telephone Encounter (Signed)
Patient had a refill of her xanax left from last year but it expired.  She would like to have that called in for her.

## 2011-09-16 ENCOUNTER — Telehealth: Payer: Self-pay

## 2011-09-16 NOTE — Telephone Encounter (Signed)
Patient called requesting something for a bad UTI, since she called at the end of the day and we are closed for the holiday per Inetta Fermo I called in some Cipro 500 mg tab 0 refills to [facility].

## 2011-12-16 ENCOUNTER — Telehealth: Payer: Self-pay | Admitting: *Deleted

## 2011-12-16 DIAGNOSIS — N760 Acute vaginitis: Secondary | ICD-10-CM

## 2011-12-16 DIAGNOSIS — B9689 Other specified bacterial agents as the cause of diseases classified elsewhere: Secondary | ICD-10-CM

## 2011-12-16 MED ORDER — METRONIDAZOLE 500 MG PO TABS: 500.0000 mg | ORAL_TABLET | Freq: Two times a day (BID) | ORAL | Status: DC

## 2011-12-16 NOTE — Telephone Encounter (Signed)
Patient is having increased fishy discharge and is unable to come in for visit due to work conflict.  We will call in flagyl for her and if symptoms persist she will come in for a visit.

## 2012-01-28 ENCOUNTER — Telehealth: Payer: Self-pay | Admitting: *Deleted

## 2012-01-28 DIAGNOSIS — B379 Candidiasis, unspecified: Secondary | ICD-10-CM

## 2012-01-28 MED ORDER — FLUCONAZOLE 150 MG PO TABS: 150.0000 mg | ORAL_TABLET | Freq: Once | ORAL | Status: DC

## 2012-01-28 NOTE — Telephone Encounter (Signed)
[place] spent a considerable amount of time in a hot tub.  Now she is having a thick white discharge and itching very bad.  She would like Diflucan called in.

## 2012-05-02 ENCOUNTER — Encounter: Payer: Self-pay | Admitting: Obstetrics & Gynecology

## 2012-05-02 ENCOUNTER — Ambulatory Visit (INDEPENDENT_AMBULATORY_CARE_PROVIDER_SITE_OTHER): Payer: PRIVATE HEALTH INSURANCE | Admitting: Obstetrics & Gynecology

## 2012-05-02 VITALS — BP 123/68 | HR 67 | Ht 68.0 in | Wt 196.0 lb

## 2012-05-02 DIAGNOSIS — Z113 Encounter for screening for infections with a predominantly sexual mode of transmission: Secondary | ICD-10-CM

## 2012-05-02 DIAGNOSIS — Z0000 Encounter for general adult medical examination without abnormal findings: Secondary | ICD-10-CM

## 2012-05-02 DIAGNOSIS — Z124 Encounter for screening for malignant neoplasm of cervix: Secondary | ICD-10-CM

## 2012-05-02 DIAGNOSIS — Z01419 Encounter for gynecological examination (general) (routine) without abnormal findings: Secondary | ICD-10-CM

## 2012-05-02 LAB — HEPATITIS B SURFACE ANTIGEN: Hepatitis B Surface Ag: NEGATIVE

## 2012-05-02 MED ORDER — ALPRAZOLAM 0.5 MG PO TABS: 0.5000 mg | ORAL_TABLET | Freq: Every evening | ORAL | Status: DC | PRN

## 2012-05-02 NOTE — Progress Notes (Signed)
Subjective:    Sydney West is a 27 y.o. female who presents for an annual exam. The patient has no complaints today. She would like her IUD removed. She would like a refill of xanax (she takes about 20 per year for anxiety.)The patient is not currently sexually active. GYN screening history: last pap: was normal. The patient wears seatbelts: yes. The patient participates in regular exercise: yes. Has the patient ever been transfused or tattooed?: yes. (tattoo)The patient reports that there is not domestic violence in her life.   Menstrual History: OB History   Grav Para Term Preterm Abortions TAB SAB Ect Mult Living   2 2              Menarche age: 56  Patient's last menstrual period was 04/17/2012.    The following portions of the patient's history were reviewed and updated as appropriate: allergies, current medications, past family history, past medical history, past social history, past surgical history and problem list.  Review of Systems A comprehensive review of systems was negative. She lives with her 24 and 69 yo kids. She recently broke up with her boyfriend and wants STI testing. 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    Objective:    BP 123/68  Pulse 67  Ht 5\' 8"  (1.727 m)  Wt 196 lb (88.905 kg)  BMI 29.81 kg/m2  LMP 04/17/2012  General Appearance:    Alert, cooperative, no distress, appears stated age  Head:    Normocephalic, without obvious abnormality, atraumatic  Eyes:    PERRL, conjunctiva/corneas clear, EOM's intact, fundi    benign, both eyes  Ears:    Normal TM's and external ear canals, both ears  Nose:   Nares normal, septum midline, mucosa normal, no drainage    or sinus tenderness  Throat:   Lips, mucosa, and tongue normal; teeth and gums normal  Neck:   Supple, symmetrical, trachea midline, no adenopathy;    thyroid:  no enlargement/tenderness/nodules; no carotid   bruit or JVD  Back:     Symmetric, no curvature, ROM normal, no CVA  tenderness  Lungs:     Clear to auscultation bilaterally, respirations unlabored  Chest Wall:    No tenderness or deformity   Heart:    Regular rate and rhythm, S1 and S2 normal, no murmur, rub   or gallop  Breast Exam:    No tenderness, masses, or nipple abnormality  Abdomen:     Soft, non-tender, bowel sounds active all four quadrants,    no masses, no organomegaly  Genitalia:    Normal female without lesion, discharge or tenderness, NSSR, NT, mobile, normal adnexal exam     Extremities:   Extremities normal, atraumatic, no cyanosis or edema  Pulses:   2+ and symmetric all extremities  Skin:   Skin color, texture, turgor normal, no rashes or lesions  Lymph nodes:   Cervical, supraclavicular, and axillary nodes normal  Neurologic:   CNII-XII intact, normal strength, sensation and reflexes    throughout  .    Assessment:    Healthy female exam.    Plan:     Breast self exam technique reviewed and patient encouraged to perform self-exam monthly. Thin prep Pap smear.  STI testing I have recommended less/no tanning bed visits.

## 2012-05-16 ENCOUNTER — Ambulatory Visit: Payer: PRIVATE HEALTH INSURANCE | Admitting: Obstetrics & Gynecology

## 2012-05-16 DIAGNOSIS — Z30432 Encounter for removal of intrauterine contraceptive device: Secondary | ICD-10-CM

## 2013-01-19 ENCOUNTER — Other Ambulatory Visit: Payer: Self-pay

## 2013-01-30 ENCOUNTER — Telehealth: Payer: Self-pay

## 2013-01-30 NOTE — Telephone Encounter (Signed)
Called in patient some Orthocylcin to [facility]. She didn't have any at her pharmacy.

## 2013-05-25 ENCOUNTER — Ambulatory Visit: Payer: PRIVATE HEALTH INSURANCE | Admitting: Obstetrics and Gynecology

## 2013-07-13 ENCOUNTER — Ambulatory Visit (INDEPENDENT_AMBULATORY_CARE_PROVIDER_SITE_OTHER): Payer: Medicaid Other | Admitting: Obstetrics & Gynecology

## 2013-07-13 ENCOUNTER — Encounter: Payer: Self-pay | Admitting: Obstetrics & Gynecology

## 2013-07-13 ENCOUNTER — Other Ambulatory Visit (HOSPITAL_COMMUNITY)
Admission: RE | Admit: 2013-07-13 | Discharge: 2013-07-13 | Disposition: A | Discharge status: Home / Self Care | Payer: Medicaid Other | Source: Ambulatory Visit | Attending: Obstetrics & Gynecology | Admitting: Obstetrics & Gynecology

## 2013-07-13 VITALS — BP 124/78 | HR 104 | Ht 68.0 in | Wt 187.0 lb

## 2013-07-13 DIAGNOSIS — Z113 Encounter for screening for infections with a predominantly sexual mode of transmission: Secondary | ICD-10-CM

## 2013-07-13 DIAGNOSIS — Z01419 Encounter for gynecological examination (general) (routine) without abnormal findings: Secondary | ICD-10-CM | POA: Insufficient documentation

## 2013-07-13 DIAGNOSIS — N760 Acute vaginitis: Secondary | ICD-10-CM | POA: Insufficient documentation

## 2013-07-13 DIAGNOSIS — K649 Unspecified hemorrhoids: Secondary | ICD-10-CM

## 2013-07-13 DIAGNOSIS — Z0000 Encounter for general adult medical examination without abnormal findings: Secondary | ICD-10-CM

## 2013-07-13 LAB — CBC
HCT: 42.1 % (ref 36.0–46.0)
HEMOGLOBIN: 14.6 g/dL (ref 12.0–15.0)
MCH: 29.4 pg (ref 26.0–34.0)
MCHC: 34.7 g/dL (ref 30.0–36.0)
MCV: 84.7 fL (ref 78.0–100.0)
Platelets: 376 10*3/uL (ref 150–400)
RBC: 4.97 MIL/uL (ref 3.87–5.11)
RDW: 13.4 % (ref 11.5–15.5)
WBC: 9.1 10*3/uL (ref 4.0–10.5)

## 2013-07-13 MED ORDER — HYDROCORTISONE ACETATE 25 MG RE SUPP: 25.0000 mg | Freq: Two times a day (BID) | RECTAL | Status: DC

## 2013-07-13 NOTE — Progress Notes (Signed)
    GYNECOLOGY CLINIC ANNUAL PREVENTATIVE CARE ENCOUNTER NOTE  Subjective:     Sydney West is a 28 y.o. [id number] female here for a routine annual gynecologic exam.  Current complaints: occasional fatigue, insomnia and sweats. Desires CBC, TSH check. Also desires STI screen.  Patient also reports bothersome hemorrhoids, not treated by OTC remedies.   Gynecologic History Patient's last menstrual period was 06/28/2013. Contraception: condoms Last Pap: 05/02/12. Results were: normal  Obstetric History OB History  Gravida Para Term Preterm AB SAB TAB Ectopic Multiple Living  2 2 2       2     # Outcome Date GA Lbr Len/2nd Weight Sex Delivery Anes PTL Lv  2 TRM      SVD     1 TRM      SVD        The following portions of the patient's history were reviewed and updated as appropriate: allergies, current medications, past family history, past medical history, past social history, past surgical history and problem list.  Review of Systems Pertinent items are noted in HPI.    Objective:   BP 124/78  Pulse 104  Ht 5\' 8"  (1.727 m)  Wt 187 lb (84.823 kg)  BMI 28.44 kg/m2  LMP 06/28/2013 GENERAL: Well-developed, well-nourished female in no acute distress.  HEENT: Normocephalic, atraumatic. Sclerae anicteric.  NECK: Supple. Normal thyroid.  LUNGS: Clear to auscultation bilaterally.  HEART: Regular rate and rhythm. BREASTS: Symmetric in size. No masses, skin changes, nipple drainage, or lymphadenopathy. ABDOMEN: Soft, nontender, nondistended. No organomegaly. PELVIC: Normal external female genitalia. Vagina is pink and rugated.  Normal discharge. Normal cervix contour. Pap smear obtained. Uterus is normal in size. No adnexal mass or tenderness. Small external hemorrhoids seen, no erythema, no thrombosis noted. EXTREMITIES: No cyanosis, clubbing, or edema, 2+ distal pulses.   Assessment:   Annual gynecologic examination Desires labs for healthcare maintenance and STI  screen Symptomatic hemorrhoids   Plan:   Pap and labs done, will follow up results and manage accordingly Anusol prescribed for hemorrhoids, follow up with Urgent Care or General Surgery if worsens Routine preventative health maintenance measures emphasized   Jaynie CollinsUGONNA  Sydney Liberman, MD, FACOG Attending Obstetrician & Gynecologist Faculty Practice, [facility]

## 2013-07-13 NOTE — Patient Instructions (Signed)

## 2013-07-14 LAB — COMPREHENSIVE METABOLIC PANEL
ALT: 12 U/L (ref 0–35)
AST: 13 U/L (ref 0–37)
Albumin: 4.5 g/dL (ref 3.5–5.2)
Alkaline Phosphatase: 59 U/L (ref 39–117)
BILIRUBIN TOTAL: 0.5 mg/dL (ref 0.2–1.2)
BUN: 8 mg/dL (ref 6–23)
CALCIUM: 9.3 mg/dL (ref 8.4–10.5)
CO2: 24 meq/L (ref 19–32)
Chloride: 105 mEq/L (ref 96–112)
Creat: 0.81 mg/dL (ref 0.50–1.10)
GLUCOSE: 94 mg/dL (ref 70–99)
Potassium: 4.2 mEq/L (ref 3.5–5.3)
SODIUM: 138 meq/L (ref 135–145)
TOTAL PROTEIN: 6.9 g/dL (ref 6.0–8.3)

## 2013-07-14 LAB — LIPID PANEL
CHOLESTEROL: 122 mg/dL (ref 0–200)
HDL: 71 mg/dL (ref >39)
LDL Cholesterol: 40 mg/dL (ref 0–99)
TRIGLYCERIDES: 55 mg/dL (ref <150)
Total CHOL/HDL Ratio: 1.7 Ratio
VLDL: 11 mg/dL (ref 0–40)

## 2013-07-14 LAB — HEPATITIS C ANTIBODY: HCV Ab: NEGATIVE

## 2013-07-14 LAB — RPR

## 2013-07-14 LAB — HIV ANTIBODY (ROUTINE TESTING W REFLEX): HIV 1&2 Ab, 4th Generation: NONREACTIVE

## 2013-07-14 LAB — TSH: TSH: 0.99 u[IU]/mL (ref 0.350–4.500)

## 2013-09-11 ENCOUNTER — Other Ambulatory Visit (INDEPENDENT_AMBULATORY_CARE_PROVIDER_SITE_OTHER): Payer: Medicaid Other | Admitting: *Deleted

## 2013-09-11 DIAGNOSIS — N938 Other specified abnormal uterine and vaginal bleeding: Secondary | ICD-10-CM

## 2013-09-11 DIAGNOSIS — N760 Acute vaginitis: Secondary | ICD-10-CM

## 2013-09-11 DIAGNOSIS — N926 Irregular menstruation, unspecified: Secondary | ICD-10-CM

## 2013-09-11 DIAGNOSIS — N949 Unspecified condition associated with female genital organs and menstrual cycle: Secondary | ICD-10-CM

## 2013-09-11 DIAGNOSIS — A499 Bacterial infection, unspecified: Secondary | ICD-10-CM

## 2013-09-11 DIAGNOSIS — B9689 Other specified bacterial agents as the cause of diseases classified elsewhere: Secondary | ICD-10-CM

## 2013-09-11 LAB — POCT URINE PREGNANCY: Preg Test, Ur: NEGATIVE

## 2013-09-11 MED ORDER — METRONIDAZOLE 500 MG PO TABS: 500.0000 mg | ORAL_TABLET | Freq: Two times a day (BID) | ORAL | Status: DC

## 2013-09-11 NOTE — Progress Notes (Signed)
Patient started bleeding last week about a week early for her cycle to start.  She is currently taking ocp.  She did have some nausea and stomach cramping so she took a home pregnancy test twice one was negative and one had a very light line that she felt might be positive but she was not 100% sure so she is here today for a blood test to reassure her of what is going on.  She is still currently bleeding.  Urine test in the office today is negative.  Blood work is drawn and we will have these answers tomorrow.  She is also having increased vaginal odor and would like something called in for BV  She has had this in the past.  I will call in metronidazole.  We will call her tomorrow with her results of the blood work.

## 2013-09-12 LAB — HCG, QUANTITATIVE, PREGNANCY: hCG, Beta Chain, Quant, S: <2 m[IU]/mL

## 2013-12-18 ENCOUNTER — Other Ambulatory Visit (INDEPENDENT_AMBULATORY_CARE_PROVIDER_SITE_OTHER): Payer: Medicaid Other | Admitting: *Deleted

## 2013-12-18 DIAGNOSIS — N912 Amenorrhea, unspecified: Secondary | ICD-10-CM

## 2013-12-18 LAB — POCT URINE PREGNANCY: PREG TEST UR: POSITIVE

## 2013-12-18 NOTE — Progress Notes (Signed)
Patient is here because she had conflicting home pregnancy tests.   She had one negative and two positive.  Her last period was September 6 and she has not missed any pills.  Pregnancy test in the office today is positive.  Patient is not sure if she wants to continue the pregnancy.  She will call us back once she talks to her partner about there options.

## 2014-01-15 ENCOUNTER — Encounter: Payer: Self-pay | Admitting: Obstetrics & Gynecology

## 2014-03-29 ENCOUNTER — Telehealth: Payer: Self-pay | Admitting: *Deleted

## 2014-03-29 MED ORDER — NORGESTIMATE-ETH ESTRADIOL 0.25-35 MG-MCG PO TABS: 1.0000 | ORAL_TABLET | Freq: Every day | ORAL | Status: DC

## 2014-03-29 NOTE — Telephone Encounter (Signed)
Patient needs refill of ocp and appt for physical/tn

## 2015-03-28 ENCOUNTER — Other Ambulatory Visit: Payer: Self-pay | Admitting: Obstetrics & Gynecology

## 2015-03-28 ENCOUNTER — Telehealth: Payer: Self-pay | Admitting: *Deleted

## 2015-03-28 DIAGNOSIS — Z3041 Encounter for surveillance of contraceptive pills: Secondary | ICD-10-CM

## 2015-03-28 DIAGNOSIS — F419 Anxiety disorder, unspecified: Secondary | ICD-10-CM

## 2015-03-28 MED ORDER — ALPRAZOLAM 0.5 MG PO TABS: 0.5000 mg | ORAL_TABLET | Freq: Every evening | ORAL | Status: DC | PRN

## 2015-03-28 NOTE — Telephone Encounter (Signed)
Pt called requesting refill on birth control, sent refill to pharmacy, scheduled appt with Dr Shawnie PonsPratt for Annual Exam on 05-09-15 @ 0900.

## 2015-03-28 NOTE — Telephone Encounter (Signed)
Pt requesting refill on Xanax to help her relax to sleep due to personal events that have happened recently, a job change and having to sell her home.  Dr Shawnie PonsPratt reviewed pt chart, okay to refill, called rx to pharmacy.

## 2015-05-09 ENCOUNTER — Other Ambulatory Visit (HOSPITAL_COMMUNITY)
Admission: RE | Admit: 2015-05-09 | Discharge: 2015-05-09 | Disposition: A | Discharge status: Home / Self Care | Payer: Medicaid Other | Source: Ambulatory Visit | Attending: Family Medicine | Admitting: Family Medicine

## 2015-05-09 ENCOUNTER — Ambulatory Visit (INDEPENDENT_AMBULATORY_CARE_PROVIDER_SITE_OTHER): Payer: Medicaid Other | Admitting: Family Medicine

## 2015-05-09 ENCOUNTER — Encounter: Payer: Self-pay | Admitting: Family Medicine

## 2015-05-09 VITALS — BP 113/72 | HR 71 | Resp 16 | Ht 68.5 in | Wt 220.0 lb

## 2015-05-09 DIAGNOSIS — Z113 Encounter for screening for infections with a predominantly sexual mode of transmission: Secondary | ICD-10-CM | POA: Insufficient documentation

## 2015-05-09 DIAGNOSIS — Z3041 Encounter for surveillance of contraceptive pills: Secondary | ICD-10-CM

## 2015-05-09 DIAGNOSIS — Z0000 Encounter for general adult medical examination without abnormal findings: Secondary | ICD-10-CM | POA: Diagnosis not present

## 2015-05-09 DIAGNOSIS — F419 Anxiety disorder, unspecified: Secondary | ICD-10-CM

## 2015-05-09 DIAGNOSIS — Z1151 Encounter for screening for human papillomavirus (HPV): Secondary | ICD-10-CM | POA: Insufficient documentation

## 2015-05-09 DIAGNOSIS — Z01419 Encounter for gynecological examination (general) (routine) without abnormal findings: Secondary | ICD-10-CM | POA: Insufficient documentation

## 2015-05-09 DIAGNOSIS — K648 Other hemorrhoids: Secondary | ICD-10-CM

## 2015-05-09 DIAGNOSIS — K644 Residual hemorrhoidal skin tags: Secondary | ICD-10-CM

## 2015-05-09 LAB — TSH: TSH: 1.5 mIU/L

## 2015-05-09 MED ORDER — NORGESTIMATE-ETH ESTRADIOL 0.25-35 MG-MCG PO TABS: 1.0000 | ORAL_TABLET | Freq: Every day | ORAL | Status: DC

## 2015-05-09 MED ORDER — HYDROCORTISONE ACE-PRAMOXINE 1-1 % RE FOAM: 1.0000 | Freq: Two times a day (BID) | RECTAL | Status: DC

## 2015-05-09 MED ORDER — ALPRAZOLAM 0.5 MG PO TABS: 0.5000 mg | ORAL_TABLET | Freq: Every evening | ORAL | Status: DC | PRN

## 2015-05-09 NOTE — Progress Notes (Signed)
  Subjective:     Sydney West is a 30 y.o. female and is here for a comprehensive physical exam. The patient reports no problems. States she needs a refill of her Xanax for anxiety and has a hemorrhoid.  Social History   Social History  . Marital Status: Single    Spouse Name: N/A  . Number of Children: N/A  . Years of Education: N/A   Occupational History  . Not on file.   Social History Main Topics  . Smoking status: Current Every Day Smoker -- 0.50 packs/day    Types: Cigarettes  . Smokeless tobacco: Never Used  . Alcohol Use: Yes     Comment: SOCIALLY  . Drug Use: No  . Sexual Activity:    Partners: Male    Birth Control/ Protection: Condom, Pill   Other Topics Concern  . Not on file   Social History Narrative   Health Maintenance  Topic Date Due  . Janet Berlin  04/21/2003  . INFLUENZA VACCINE  10/15/2014  . PAP SMEAR  07/13/2016  . HIV Screening  Completed    The following portions of the patient's history were reviewed and updated as appropriate: allergies, current medications, past family history, past medical history, past social history, past surgical history and problem list.  Review of Systems Pertinent items noted in HPI and remainder of comprehensive ROS otherwise negative.   Objective:    BP 113/72 mmHg  Pulse 71  Resp 16  Ht 5' 8.5" (1.74 m)  Wt 220 lb (99.791 kg)  BMI 32.96 kg/m2 General appearance: alert, cooperative and appears stated age Head: Normocephalic, without obvious abnormality, atraumatic Neck: no adenopathy, supple, symmetrical, trachea midline and thyroid not enlarged, symmetric, no tenderness/mass/nodules Lungs: clear to auscultation bilaterally Breasts: normal appearance, no masses or tenderness Heart: regular rate and rhythm, S1, S2 normal, no murmur, click, rub or gallop Abdomen: soft, non-tender; bowel sounds normal; no masses,  no organomegaly Pelvic: cervix normal in appearance, external genitalia normal, no adnexal  masses or tenderness, no cervical motion tenderness, uterus normal size, shape, and consistency and vagina normal without discharge Extremities: extremities normal, atraumatic, no cyanosis or edema Pulses: 2+ and symmetric Skin: Skin color, texture, turgor normal. No rashes or lesions Lymph nodes: Cervical, supraclavicular, and axillary nodes normal. Neurologic: Grossly normal    Assessment:    Healthy female exam.      Plan:     1. Well woman exam with routine gynecological exam - Cytology - PAP - TSH  2. Anxiety  - ALPRAZolam (XANAX) 0.5 MG tablet; Take 1 tablet (0.5 mg total) by mouth at bedtime as needed for sleep.  Dispense: 30 tablet; Refill: 1  3. Encounter for surveillance of contraceptive pills  - norgestimate-ethinyl estradiol (SPRINTEC 28) 0.25-35 MG-MCG tablet; Take 1 tablet by mouth daily.  Dispense: 28 tablet; Refill: 11  4. Screen for STD (sexually transmitted disease)  - HIV antibody - RPR - Hepatitis B surface antigen - Hepatitis C antibody - HSV(herpes smplx)abs-1+2(IgG+IgM)-bld  5. External hemorrhoid  - hydrocortisone-pramoxine (PROCTOFOAM HC) rectal foam; Place 1 applicator rectally 2 (two) times daily.  Dispense: 10 g; Refill: 0  See After Visit Summary for Counseling Recommendations

## 2015-05-09 NOTE — Patient Instructions (Signed)
Preventive Care for Adults, Female A healthy lifestyle and preventive care can promote health and wellness. Preventive health guidelines for women include the following key practices.  A routine yearly physical is a good way to check with your health care provider about your health and preventive screening. It is a chance to share any concerns and updates on your health and to receive a thorough exam.  Visit your dentist for a routine exam and preventive care every 6 months. Brush your teeth twice a day and floss once a day. Good oral hygiene prevents tooth decay and gum disease.  The frequency of eye exams is based on your age, health, family medical history, use of contact lenses, and other factors. Follow your health care provider's recommendations for frequency of eye exams.  Eat a healthy diet. Foods like vegetables, fruits, whole grains, low-fat dairy products, and lean protein foods contain the nutrients you need without too many calories. Decrease your intake of foods high in solid fats, added sugars, and salt. Eat the right amount of calories for you.Get information about a proper diet from your health care provider, if necessary.  Regular physical exercise is one of the most important things you can do for your health. Most adults should get at least 150 minutes of moderate-intensity exercise (any activity that increases your heart rate and causes you to sweat) each week. In addition, most adults need muscle-strengthening exercises on 2 or more days a week.  Maintain a healthy weight. The body mass index (BMI) is a screening tool to identify possible weight problems. It provides an estimate of body fat based on height and weight. Your health care provider can find your BMI and can help you achieve or maintain a healthy weight.For adults 20 years and older:  A BMI below 18.5 is considered underweight.  A BMI of 18.5 to 24.9 is normal.  A BMI of 25 to 29.9 is considered overweight.  A  BMI of 30 and above is considered obese.  Maintain normal blood lipids and cholesterol levels by exercising and minimizing your intake of saturated fat. Eat a balanced diet with plenty of fruit and vegetables. Blood tests for lipids and cholesterol should begin at age 68 and be repeated every 5 years. If your lipid or cholesterol levels are high, you are over 50, or you are at high risk for heart disease, you may need your cholesterol levels checked more frequently.Ongoing high lipid and cholesterol levels should be treated with medicines if diet and exercise are not working.  If you smoke, find out from your health care provider how to quit. If you do not use tobacco, do not start.  Lung cancer screening is recommended for adults aged 35-80 years who are at high risk for developing lung cancer because of a history of smoking. A yearly low-dose CT scan of the lungs is recommended for people who have at least a 30-pack-year history of smoking and are a current smoker or have quit within the past 15 years. A pack year of smoking is smoking an average of 1 pack of cigarettes a day for 1 year (for example: 1 pack a day for 30 years or 2 packs a day for 15 years). Yearly screening should continue until the smoker has stopped smoking for at least 15 years. Yearly screening should be stopped for people who develop a health problem that would prevent them from having lung cancer treatment.  If you are pregnant, do not drink alcohol. If you are  breastfeeding, be very cautious about drinking alcohol. If you are not pregnant and choose to drink alcohol, do not have more than 1 drink per day. One drink is considered to be 12 ounces (355 mL) of beer, 5 ounces (148 mL) of wine, or 1.5 ounces (44 mL) of liquor.  Avoid use of street drugs. Do not share needles with anyone. Ask for help if you need support or instructions about stopping the use of drugs.  High blood pressure causes heart disease and increases the risk  of stroke. Your blood pressure should be checked at least every 1 to 2 years. Ongoing high blood pressure should be treated with medicines if weight loss and exercise do not work.  If you are 55-79 years old, ask your health care provider if you should take aspirin to prevent strokes.  Diabetes screening is done by taking a blood sample to check your blood glucose level after you have not eaten for a certain period of time (fasting). If you are not overweight and you do not have risk factors for diabetes, you should be screened once every 3 years starting at age 45. If you are overweight or obese and you are 40-70 years of age, you should be screened for diabetes every year as part of your cardiovascular risk assessment.  Breast cancer screening is essential preventive care for women. You should practice "breast self-awareness." This means understanding the normal appearance and feel of your breasts and may include breast self-examination. Any changes detected, no matter how small, should be reported to a health care provider. Women in their 20s and 30s should have a clinical breast exam (CBE) by a health care provider as part of a regular health exam every 1 to 3 years. After age 40, women should have a CBE every year. Starting at age 40, women should consider having a mammogram (breast X-ray test) every year. Women who have a family history of breast cancer should talk to their health care provider about genetic screening. Women at a high risk of breast cancer should talk to their health care providers about having an MRI and a mammogram every year.  Breast cancer gene (BRCA)-related cancer risk assessment is recommended for women who have family members with BRCA-related cancers. BRCA-related cancers include breast, ovarian, tubal, and peritoneal cancers. Having family members with these cancers may be associated with an increased risk for harmful changes (mutations) in the breast cancer genes BRCA1 and  BRCA2. Results of the assessment will determine the need for genetic counseling and BRCA1 and BRCA2 testing.  Your health care provider may recommend that you be screened regularly for cancer of the pelvic organs (ovaries, uterus, and vagina). This screening involves a pelvic examination, including checking for microscopic changes to the surface of your cervix (Pap test). You may be encouraged to have this screening done every 3 years, beginning at age 21.  For women ages 30-65, health care providers may recommend pelvic exams and Pap testing every 3 years, or they may recommend the Pap and pelvic exam, combined with testing for human papilloma virus (HPV), every 5 years. Some types of HPV increase your risk of cervical cancer. Testing for HPV may also be done on women of any age with unclear Pap test results.  Other health care providers may not recommend any screening for nonpregnant women who are considered low risk for pelvic cancer and who do not have symptoms. Ask your health care provider if a screening pelvic exam is right for   you.  If you have had past treatment for cervical cancer or a condition that could lead to cancer, you need Pap tests and screening for cancer for at least 20 years after your treatment. If Pap tests have been discontinued, your risk factors (such as having a new sexual partner) need to be reassessed to determine if screening should resume. Some women have medical problems that increase the chance of getting cervical cancer. In these cases, your health care provider may recommend more frequent screening and Pap tests.  Colorectal cancer can be detected and often prevented. Most routine colorectal cancer screening begins at the age of 51 years and continues through age 58 years. However, your health care provider may recommend screening at an earlier age if you have risk factors for colon cancer. On a yearly basis, your health care provider may provide home test kits to check  for hidden blood in the stool. Use of a small camera at the end of a tube, to directly examine the colon (sigmoidoscopy or colonoscopy), can detect the earliest forms of colorectal cancer. Talk to your health care provider about this at age 63, when routine screening begins. Direct exam of the colon should be repeated every 5-10 years through age 2 years, unless early forms of precancerous polyps or small growths are found.  People who are at an increased risk for hepatitis B should be screened for this virus. You are considered at high risk for hepatitis B if:  You were born in a country where hepatitis B occurs often. Talk with your health care provider about which countries are considered high risk.  Your parents were born in a high-risk country and you have not received a shot to protect against hepatitis B (hepatitis B vaccine).  You have HIV or AIDS.  You use needles to inject street drugs.  You live with, or have sex with, someone who has hepatitis B.  You get hemodialysis treatment.  You take certain medicines for conditions like cancer, organ transplantation, and autoimmune conditions.  Hepatitis C blood testing is recommended for all people born from [birth year] through 1965 and any individual with known risks for hepatitis C.  Practice safe sex. Use condoms and avoid high-risk sexual practices to reduce the spread of sexually transmitted infections (STIs). STIs include gonorrhea, chlamydia, syphilis, trichomonas, herpes, HPV, and human immunodeficiency virus (HIV). Herpes, HIV, and HPV are viral illnesses that have no cure. They can result in disability, cancer, and death.  You should be screened for sexually transmitted illnesses (STIs) including gonorrhea and chlamydia if:  You are sexually active and are younger than 24 years.  You are older than 24 years and your health care provider tells you that you are at risk for this type of infection.  Your sexual activity has changed  since you were last screened and you are at an increased risk for chlamydia or gonorrhea. Ask your health care provider if you are at risk.  If you are at risk of being infected with HIV, it is recommended that you take a prescription medicine daily to prevent HIV infection. This is called preexposure prophylaxis (PrEP). You are considered at risk if:  You are sexually active and do not regularly use condoms or know the HIV status of your partner(s).  You take drugs by injection.  You are sexually active with a partner who has HIV.  Talk with your health care provider about whether you are at high risk of being infected with HIV. If  you choose to begin PrEP, you should first be tested for HIV. You should then be tested every 3 months for as long as you are taking PrEP.  Osteoporosis is a disease in which the bones lose minerals and strength with aging. This can result in serious bone fractures or breaks. The risk of osteoporosis can be identified using a bone density scan. Women ages 67 years and over and women at risk for fractures or osteoporosis should discuss screening with their health care providers. Ask your health care provider whether you should take a calcium supplement or vitamin D to reduce the rate of osteoporosis.  Menopause can be associated with physical symptoms and risks. Hormone replacement therapy is available to decrease symptoms and risks. You should talk to your health care provider about whether hormone replacement therapy is right for you.  Use sunscreen. Apply sunscreen liberally and repeatedly throughout the day. You should seek shade when your shadow is shorter than you. Protect yourself by wearing long sleeves, pants, a wide-brimmed hat, and sunglasses year round, whenever you are outdoors.  Once a month, do a whole body skin exam, using a mirror to look at the skin on your back. Tell your health care provider of new moles, moles that have irregular borders, moles that  are larger than a pencil eraser, or moles that have changed in shape or color.  Stay current with required vaccines (immunizations).  Influenza vaccine. All adults should be immunized every year.  Tetanus, diphtheria, and acellular pertussis (Td, Tdap) vaccine. Pregnant women should receive 1 dose of Tdap vaccine during each pregnancy. The dose should be obtained regardless of the length of time since the last dose. Immunization is preferred during the 27th-36th week of gestation. An adult who has not previously received Tdap or who does not know her vaccine status should receive 1 dose of Tdap. This initial dose should be followed by tetanus and diphtheria toxoids (Td) booster doses every 10 years. Adults with an unknown or incomplete history of completing a 3-dose immunization series with Td-containing vaccines should begin or complete a primary immunization series including a Tdap dose. Adults should receive a Td booster every 10 years.  Varicella vaccine. An adult without evidence of immunity to varicella should receive 2 doses or a second dose if she has previously received 1 dose. Pregnant females who do not have evidence of immunity should receive the first dose after pregnancy. This first dose should be obtained before leaving the health care facility. The second dose should be obtained 4-8 weeks after the first dose.  Human papillomavirus (HPV) vaccine. Females aged 13-26 years who have not received the vaccine previously should obtain the 3-dose series. The vaccine is not recommended for use in pregnant females. However, pregnancy testing is not needed before receiving a dose. If a female is found to be pregnant after receiving a dose, no treatment is needed. In that case, the remaining doses should be delayed until after the pregnancy. Immunization is recommended for any person with an immunocompromised condition through the age of 61 years if she did not get any or all doses earlier. During the  3-dose series, the second dose should be obtained 4-8 weeks after the first dose. The third dose should be obtained 24 weeks after the first dose and 16 weeks after the second dose.  Zoster vaccine. One dose is recommended for adults aged 30 years or older unless certain conditions are present.  Measles, mumps, and rubella (MMR) vaccine. Adults born  before 1957 generally are considered immune to measles and mumps. Adults born in [birth year] or later should have 1 or more doses of MMR vaccine unless there is a contraindication to the vaccine or there is laboratory evidence of immunity to each of the three diseases. A routine second dose of MMR vaccine should be obtained at least 28 days after the first dose for students attending postsecondary schools, health care workers, or international travelers. People who received inactivated measles vaccine or an unknown type of measles vaccine during 1963-1967 should receive 2 doses of MMR vaccine. People who received inactivated mumps vaccine or an unknown type of mumps vaccine before 1979 and are at high risk for mumps infection should consider immunization with 2 doses of MMR vaccine. For females of childbearing age, rubella immunity should be determined. If there is no evidence of immunity, females who are not pregnant should be vaccinated. If there is no evidence of immunity, females who are pregnant should delay immunization until after pregnancy. Unvaccinated health care workers born before [birth year] who lack laboratory evidence of measles, mumps, or rubella immunity or laboratory confirmation of disease should consider measles and mumps immunization with 2 doses of MMR vaccine or rubella immunization with 1 dose of MMR vaccine.  Pneumococcal 13-valent conjugate (PCV13) vaccine. When indicated, a person who is uncertain of his immunization history and has no record of immunization should receive the PCV13 vaccine. All adults 68 years of age and older should receive this  vaccine. An adult aged 35 years or older who has certain medical conditions and has not been previously immunized should receive 1 dose of PCV13 vaccine. This PCV13 should be followed with a dose of pneumococcal polysaccharide (PPSV23) vaccine. Adults who are at high risk for pneumococcal disease should obtain the PPSV23 vaccine at least 8 weeks after the dose of PCV13 vaccine. Adults older than 31 years of age who have normal immune system function should obtain the PPSV23 vaccine dose at least 1 year after the dose of PCV13 vaccine.  Pneumococcal polysaccharide (PPSV23) vaccine. When PCV13 is also indicated, PCV13 should be obtained first. All adults aged 27 years and older should be immunized. An adult younger than age 5 years who has certain medical conditions should be immunized. Any person who resides in a nursing home or long-term care facility should be immunized. An adult smoker should be immunized. People with an immunocompromised condition and certain other conditions should receive both PCV13 and PPSV23 vaccines. People with human immunodeficiency virus (HIV) infection should be immunized as soon as possible after diagnosis. Immunization during chemotherapy or radiation therapy should be avoided. Routine use of PPSV23 vaccine is not recommended for American Indians, [place] Natives, or people younger than 65 years unless there are medical conditions that require PPSV23 vaccine. When indicated, people who have unknown immunization and have no record of immunization should receive PPSV23 vaccine. One-time revaccination 5 years after the first dose of PPSV23 is recommended for people aged 19-64 years who have chronic kidney failure, nephrotic syndrome, asplenia, or immunocompromised conditions. People who received 1-2 doses of PPSV23 before age 61 years should receive another dose of PPSV23 vaccine at age 10 years or later if at least 5 years have passed since the previous dose. Doses of PPSV23 are not  needed for people immunized with PPSV23 at or after age 71 years.  Meningococcal vaccine. Adults with asplenia or persistent complement component deficiencies should receive 2 doses of quadrivalent meningococcal conjugate (MenACWY-D) vaccine. The doses should be obtained  at least 2 months apart. Microbiologists working with certain meningococcal bacteria, [place] recruits, people at risk during an outbreak, and people who travel to or live in countries with a high rate of meningitis should be immunized. A first-year college student up through age 34 years who is living in a residence hall should receive a dose if she did not receive a dose on or after her 16th birthday. Adults who have certain high-risk conditions should receive one or more doses of vaccine.  Hepatitis A vaccine. Adults who wish to be protected from this disease, have certain high-risk conditions, work with hepatitis A-infected animals, work in hepatitis A research labs, or travel to or work in countries with a high rate of hepatitis A should be immunized. Adults who were previously unvaccinated and who anticipate close contact with an international adoptee during the first 60 days after arrival in the [place] from a country with a high rate of hepatitis A should be immunized.  Hepatitis B vaccine. Adults who wish to be protected from this disease, have certain high-risk conditions, may be exposed to blood or other infectious body fluids, are household contacts or sex partners of hepatitis B positive people, are clients or workers in certain care facilities, or travel to or work in countries with a high rate of hepatitis B should be immunized.  Haemophilus influenzae type b (Hib) vaccine. A previously unvaccinated person with asplenia or sickle cell disease or having a scheduled splenectomy should receive 1 dose of Hib vaccine. Regardless of previous immunization, a recipient of a hematopoietic stem cell transplant should receive a  3-dose series 6-12 months after her successful transplant. Hib vaccine is not recommended for adults with HIV infection. Preventive Services / Frequency Ages 35 to 4 years  Blood pressure check.** / Every 3-5 years.  Lipid and cholesterol check.** / Every 5 years beginning at age 60.  Clinical breast exam.** / Every 3 years for women in their 71s and 10s.  BRCA-related cancer risk assessment.** / For women who have family members with a BRCA-related cancer (breast, ovarian, tubal, or peritoneal cancers).  Pap test.** / Every 2 years from ages 76 through 26. Every 3 years starting at age 61 through age 76 or 93 with a history of 3 consecutive normal Pap tests.  HPV screening.** / Every 3 years from ages 37 through ages 60 to 51 with a history of 3 consecutive normal Pap tests.  Hepatitis C blood test.** / For any individual with known risks for hepatitis C.  Skin self-exam. / Monthly.  Influenza vaccine. / Every year.  Tetanus, diphtheria, and acellular pertussis (Tdap, Td) vaccine.** / Consult your health care provider. Pregnant women should receive 1 dose of Tdap vaccine during each pregnancy. 1 dose of Td every 10 years.  Varicella vaccine.** / Consult your health care provider. Pregnant females who do not have evidence of immunity should receive the first dose after pregnancy.  HPV vaccine. / 3 doses over 6 months, if 93 and younger. The vaccine is not recommended for use in pregnant females. However, pregnancy testing is not needed before receiving a dose.  Measles, mumps, rubella (MMR) vaccine.** / You need at least 1 dose of MMR if you were born in 1957 or later. You may also need a 2nd dose. For females of childbearing age, rubella immunity should be determined. If there is no evidence of immunity, females who are not pregnant should be vaccinated. If there is no evidence of immunity, females who are  pregnant should delay immunization until after pregnancy.  Pneumococcal  13-valent conjugate (PCV13) vaccine.** / Consult your health care provider.  Pneumococcal polysaccharide (PPSV23) vaccine.** / 1 to 2 doses if you smoke cigarettes or if you have certain conditions.  Meningococcal vaccine.** / 1 dose if you are age 68 to 8 years and a Market researcher living in a residence hall, or have one of several medical conditions, you need to get vaccinated against meningococcal disease. You may also need additional booster doses.  Hepatitis A vaccine.** / Consult your health care provider.  Hepatitis B vaccine.** / Consult your health care provider.  Haemophilus influenzae type b (Hib) vaccine.** / Consult your health care provider. Ages 7 to 53 years  Blood pressure check.** / Every year.  Lipid and cholesterol check.** / Every 5 years beginning at age 25 years.  Lung cancer screening. / Every year if you are aged 11-80 years and have a 30-pack-year history of smoking and currently smoke or have quit within the past 15 years. Yearly screening is stopped once you have quit smoking for at least 15 years or develop a health problem that would prevent you from having lung cancer treatment.  Clinical breast exam.** / Every year after age 48 years.  BRCA-related cancer risk assessment.** / For women who have family members with a BRCA-related cancer (breast, ovarian, tubal, or peritoneal cancers).  Mammogram.** / Every year beginning at age 41 years and continuing for as long as you are in good health. Consult with your health care provider.  Pap test.** / Every 3 years starting at age 65 years through age 37 or 70 years with a history of 3 consecutive normal Pap tests.  HPV screening.** / Every 3 years from ages 72 years through ages 60 to 40 years with a history of 3 consecutive normal Pap tests.  Fecal occult blood test (FOBT) of stool. / Every year beginning at age 21 years and continuing until age 5 years. You may not need to do this test if you get  a colonoscopy every 10 years.  Flexible sigmoidoscopy or colonoscopy.** / Every 5 years for a flexible sigmoidoscopy or every 10 years for a colonoscopy beginning at age 35 years and continuing until age 48 years.  Hepatitis C blood test.** / For all people born from [birth year] through 1966 and any individual with known risks for hepatitis C.  Skin self-exam. / Monthly.  Influenza vaccine. / Every year.  Tetanus, diphtheria, and acellular pertussis (Tdap/Td) vaccine.** / Consult your health care provider. Pregnant women should receive 1 dose of Tdap vaccine during each pregnancy. 1 dose of Td every 10 years.  Varicella vaccine.** / Consult your health care provider. Pregnant females who do not have evidence of immunity should receive the first dose after pregnancy.  Zoster vaccine.** / 1 dose for adults aged 30 years or older.  Measles, mumps, rubella (MMR) vaccine.** / You need at least 1 dose of MMR if you were born in 1957 or later. You may also need a second dose. For females of childbearing age, rubella immunity should be determined. If there is no evidence of immunity, females who are not pregnant should be vaccinated. If there is no evidence of immunity, females who are pregnant should delay immunization until after pregnancy.  Pneumococcal 13-valent conjugate (PCV13) vaccine.** / Consult your health care provider.  Pneumococcal polysaccharide (PPSV23) vaccine.** / 1 to 2 doses if you smoke cigarettes or if you have certain conditions.  Meningococcal vaccine.** /  Consult your health care provider.  Hepatitis A vaccine.** / Consult your health care provider.  Hepatitis B vaccine.** / Consult your health care provider.  Haemophilus influenzae type b (Hib) vaccine.** / Consult your health care provider. Ages 15 years and over  Blood pressure check.** / Every year.  Lipid and cholesterol check.** / Every 5 years beginning at age 62 years.  Lung cancer screening. / Every year if you  are aged 36-80 years and have a 30-pack-year history of smoking and currently smoke or have quit within the past 15 years. Yearly screening is stopped once you have quit smoking for at least 15 years or develop a health problem that would prevent you from having lung cancer treatment.  Clinical breast exam.** / Every year after age 36 years.  BRCA-related cancer risk assessment.** / For women who have family members with a BRCA-related cancer (breast, ovarian, tubal, or peritoneal cancers).  Mammogram.** / Every year beginning at age 10 years and continuing for as long as you are in good health. Consult with your health care provider.  Pap test.** / Every 3 years starting at age 28 years through age 58 or 80 years with 3 consecutive normal Pap tests. Testing can be stopped between 65 and 70 years with 3 consecutive normal Pap tests and no abnormal Pap or HPV tests in the past 10 years.  HPV screening.** / Every 3 years from ages 22 years through ages 67 or 53 years with a history of 3 consecutive normal Pap tests. Testing can be stopped between 65 and 70 years with 3 consecutive normal Pap tests and no abnormal Pap or HPV tests in the past 10 years.  Fecal occult blood test (FOBT) of stool. / Every year beginning at age 47 years and continuing until age 81 years. You may not need to do this test if you get a colonoscopy every 10 years.  Flexible sigmoidoscopy or colonoscopy.** / Every 5 years for a flexible sigmoidoscopy or every 10 years for a colonoscopy beginning at age 65 years and continuing until age 22 years.  Hepatitis C blood test.** / For all people born from [birth year] through 1966 and any individual with known risks for hepatitis C.  Osteoporosis screening.** / A one-time screening for women ages 79 years and over and women at risk for fractures or osteoporosis.  Skin self-exam. / Monthly.  Influenza vaccine. / Every year.  Tetanus, diphtheria, and acellular pertussis (Tdap/Td)  vaccine.** / 1 dose of Td every 10 years.  Varicella vaccine.** / Consult your health care provider.  Zoster vaccine.** / 1 dose for adults aged 43 years or older.  Pneumococcal 13-valent conjugate (PCV13) vaccine.** / Consult your health care provider.  Pneumococcal polysaccharide (PPSV23) vaccine.** / 1 dose for all adults aged 40 years and older.  Meningococcal vaccine.** / Consult your health care provider.  Hepatitis A vaccine.** / Consult your health care provider.  Hepatitis B vaccine.** / Consult your health care provider.  Haemophilus influenzae type b (Hib) vaccine.** / Consult your health care provider. ** Family history and personal history of risk and conditions may change your health care provider's recommendations.   This information is not intended to replace advice given to you by your health care provider. Make sure you discuss any questions you have with your health care provider.   Document Released: 04/28/2001 Document Revised: 03/23/2014 Document Reviewed: 07/28/2010 Elsevier Interactive Patient Education [employer].

## 2015-05-10 LAB — HIV ANTIBODY (ROUTINE TESTING W REFLEX): HIV 1&2 Ab, 4th Generation: NONREACTIVE

## 2015-05-10 LAB — RPR

## 2015-05-10 LAB — HEPATITIS B SURFACE ANTIGEN: HEP B S AG: NEGATIVE

## 2015-05-10 LAB — HEPATITIS C ANTIBODY: HCV AB: NEGATIVE

## 2015-05-13 LAB — HSV(HERPES SMPLX)ABS-I+II(IGG+IGM)-BLD: HERPES SIMPLEX VRS I-IGM AB (EIA): 0.62 {index}

## 2015-05-13 LAB — CYTOLOGY - PAP

## 2015-06-26 ENCOUNTER — Encounter: Payer: Self-pay | Admitting: Family Medicine

## 2015-06-26 ENCOUNTER — Other Ambulatory Visit: Payer: Self-pay | Admitting: Family Medicine

## 2015-06-26 DIAGNOSIS — K644 Residual hemorrhoidal skin tags: Secondary | ICD-10-CM

## 2015-06-26 MED ORDER — FLUCONAZOLE 150 MG PO TABS: 150.0000 mg | ORAL_TABLET | Freq: Once | ORAL | Status: DC

## 2015-06-26 MED ORDER — HYDROCORTISONE ACE-PRAMOXINE 1-1 % RE FOAM: 1.0000 | Freq: Two times a day (BID) | RECTAL | Status: DC

## 2015-06-26 NOTE — Addendum Note (Signed)
Addended by: Gita KudoLASSITER, KRISTEN S on: 06/26/2015 04:35 PM   Modules accepted: Orders

## 2015-06-26 NOTE — Telephone Encounter (Signed)
Pt requesting refill on Proctofoam due to external hemorrhoids.  Refill sent to pharmacy per Dr Shawnie PonsPratt.

## 2015-06-26 NOTE — Telephone Encounter (Signed)
Received email that pt was experiencing vaginal itching related to a yeast infection, sent rx to pharmacy per protocol and notified pt via My Chart.

## 2015-08-07 ENCOUNTER — Other Ambulatory Visit: Payer: Self-pay | Admitting: Family Medicine

## 2015-11-08 ENCOUNTER — Other Ambulatory Visit: Payer: Self-pay | Admitting: Family Medicine

## 2015-11-08 DIAGNOSIS — F419 Anxiety disorder, unspecified: Secondary | ICD-10-CM

## 2015-11-08 NOTE — Telephone Encounter (Signed)
Rx approved by Dr Shawnie PonsPratt and called into pharmacy.

## 2015-11-08 NOTE — Telephone Encounter (Signed)
-----   Message from Olevia BowensJacinda S Battle sent at 11/08/2015 11:53 AM EDT ----- Regarding: Refill Request Contact: [phone] Wants a refill on Xanax Uses [facility] in [place]

## 2015-11-09 ENCOUNTER — Encounter: Payer: Self-pay | Admitting: *Deleted

## 2016-04-21 ENCOUNTER — Encounter: Payer: Self-pay | Admitting: Family Medicine

## 2016-04-21 DIAGNOSIS — F419 Anxiety disorder, unspecified: Secondary | ICD-10-CM

## 2016-04-21 MED ORDER — ALPRAZOLAM 0.5 MG PO TABS: 0.5000 mg | ORAL_TABLET | Freq: Every evening | ORAL | 1 refills | Status: DC | PRN

## 2016-04-21 NOTE — Telephone Encounter (Signed)
Please advise, thanks.

## 2016-04-21 NOTE — Telephone Encounter (Signed)
[facility]Pratt order.

## 2016-05-01 ENCOUNTER — Other Ambulatory Visit: Payer: Medicaid Other

## 2016-05-12 ENCOUNTER — Ambulatory Visit: Payer: Medicaid Other | Admitting: Family Medicine

## 2016-05-18 ENCOUNTER — Other Ambulatory Visit: Payer: Self-pay | Admitting: Family Medicine

## 2016-05-18 DIAGNOSIS — Z3041 Encounter for surveillance of contraceptive pills: Secondary | ICD-10-CM

## 2016-05-19 ENCOUNTER — Encounter: Payer: Self-pay | Admitting: *Deleted

## 2016-05-19 ENCOUNTER — Telehealth: Payer: Self-pay | Admitting: *Deleted

## 2016-05-19 NOTE — Telephone Encounter (Signed)
-----   Message from Lindell SparHeather L Bacon, NT sent at 05/19/2016 10:58 AM EST ----- [facility] Contact: [phone] [facility] in [place]

## 2016-05-19 NOTE — Telephone Encounter (Signed)
New rx had been sent to the pharmacy yesterday, will notify pt via my chart.

## 2016-05-22 ENCOUNTER — Ambulatory Visit: Payer: Medicaid Other | Admitting: Obstetrics & Gynecology

## 2016-05-29 ENCOUNTER — Encounter: Payer: Self-pay | Admitting: Obstetrics & Gynecology

## 2016-05-29 ENCOUNTER — Ambulatory Visit (INDEPENDENT_AMBULATORY_CARE_PROVIDER_SITE_OTHER): Payer: Commercial Managed Care - PPO | Admitting: Obstetrics & Gynecology

## 2016-05-29 ENCOUNTER — Other Ambulatory Visit (HOSPITAL_COMMUNITY)
Admission: RE | Admit: 2016-05-29 | Discharge: 2016-05-29 | Disposition: A | Discharge status: Home / Self Care | Payer: Commercial Managed Care - PPO | Source: Ambulatory Visit | Attending: Obstetrics & Gynecology | Admitting: Obstetrics & Gynecology

## 2016-05-29 VITALS — BP 130/70 | HR 92 | Resp 20 | Ht 68.5 in | Wt 202.0 lb

## 2016-05-29 DIAGNOSIS — Z113 Encounter for screening for infections with a predominantly sexual mode of transmission: Secondary | ICD-10-CM | POA: Diagnosis present

## 2016-05-29 DIAGNOSIS — Z1151 Encounter for screening for human papillomavirus (HPV): Secondary | ICD-10-CM | POA: Insufficient documentation

## 2016-05-29 DIAGNOSIS — Z01419 Encounter for gynecological examination (general) (routine) without abnormal findings: Secondary | ICD-10-CM | POA: Insufficient documentation

## 2016-05-29 DIAGNOSIS — Z0000 Encounter for general adult medical examination without abnormal findings: Secondary | ICD-10-CM | POA: Diagnosis not present

## 2016-05-29 NOTE — Progress Notes (Signed)
Subjective:    Sydney West Student is a 31 y.o. SW P2 (8 and 32 yo kids) female who presents for an annual exam. The patient has no complaints today. The patient is sexually active. GYN screening history: last pap: was normal. The patient wears seatbelts: yes. The patient participates in regular exercise: yes. Has the patient ever been transfused or tattooed?: yes. The patient reports that there is not domestic violence in her life.   Menstrual History: OB History    Gravida Para Term Preterm AB Living   3 2 2  0 1 2   SAB TAB Ectopic Multiple Live Births   0 1 0 0 2      Menarche age: [age] No LMP recorded.    The following portions of the patient's history were reviewed and updated as appropriate: allergies, current medications, past family history, past medical history, past social history, past surgical history and problem list.  Review of Systems Pertinent items are noted in HPI.   Monogamous for 4 years, living with bf and building a house together. 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 Got her flu vaccine FH- + breast cancer mGM No gyn/colon cancer   Objective:    BP 130/70 (BP Location: Left Arm, Patient Position: Sitting, Cuff Size: Normal)   Pulse 92   Resp 20   Ht 5' 8.5" (1.74 m)   Wt 202 lb (91.6 kg)   LMP 05/16/2016   BMI 30.27 kg/m   General Appearance:    Alert, cooperative, no distress, appears stated age  Head:    Normocephalic, without obvious abnormality, atraumatic  Eyes:    PERRL, conjunctiva/corneas clear, EOM's intact, fundi    benign, both eyes  Ears:    Normal TM's and external ear canals, both ears  Nose:   Nares normal, septum midline, mucosa normal, no drainage    or sinus tenderness  Throat:   Lips, mucosa, and tongue normal; teeth and gums normal  Neck:   Supple, symmetrical, trachea midline, no adenopathy;    thyroid:  no enlargement/tenderness/nodules; no carotid   bruit or JVD  Back:     Symmetric, no curvature, ROM normal, no CVA tenderness   Lungs:     Clear to auscultation bilaterally, respirations unlabored  Chest Wall:    No tenderness or deformity   Heart:    Regular rate and rhythm, S1 and S2 normal, no murmur, rub   or gallop  Breast Exam:    No tenderness, masses, or nipple abnormality  Abdomen:     Soft, non-tender, bowel sounds active all four quadrants,    no masses, no organomegaly  Genitalia:    Normal female without lesion, discharge or tenderness     Extremities:   Extremities normal, atraumatic, no cyanosis or edema  Pulses:   2+ and symmetric all extremities  Skin:   Skin color, texture, turgor normal, no rashes or lesions  Lymph nodes:   Cervical, supraclavicular, and axillary nodes normal  Neurologic:   CNII-XII intact, normal strength, sensation and reflexes    throughout  .    Assessment:    Healthy female exam.    Plan:     Thin prep Pap smear. with cotesting

## 2016-05-30 LAB — CBC
Hematocrit: 42.1 % (ref 34.0–46.6)
Hemoglobin: 14.5 g/dL (ref 11.1–15.9)
MCH: 30 pg (ref 26.6–33.0)
MCHC: 34.4 g/dL (ref 31.5–35.7)
MCV: 87 fL (ref 79–97)
PLATELETS: 311 10*3/uL (ref 150–379)
RBC: 4.83 x10E6/uL (ref 3.77–5.28)
RDW: 13.3 % (ref 12.3–15.4)
WBC: 6.1 10*3/uL (ref 3.4–10.8)

## 2016-05-30 LAB — LIPID PANEL
CHOL/HDL RATIO: 2.1 ratio (ref 0.0–4.4)
Cholesterol, Total: 180 mg/dL (ref 100–199)
HDL: 86 mg/dL (ref >39)
LDL CALC: 75 mg/dL (ref 0–99)
Triglycerides: 94 mg/dL (ref 0–149)
VLDL CHOLESTEROL CAL: 19 mg/dL (ref 5–40)

## 2016-05-30 LAB — COMPREHENSIVE METABOLIC PANEL
ALK PHOS: 76 IU/L (ref 39–117)
ALT: 14 IU/L (ref 0–32)
AST: 12 IU/L (ref 0–40)
Albumin/Globulin Ratio: 1.8 (ref 1.2–2.2)
Albumin: 4.5 g/dL (ref 3.5–5.5)
BILIRUBIN TOTAL: 0.8 mg/dL (ref 0.0–1.2)
BUN/Creatinine Ratio: 11 (ref 9–23)
BUN: 10 mg/dL (ref 6–20)
CO2: 22 mmol/L (ref 18–29)
CREATININE: 0.9 mg/dL (ref 0.57–1.00)
Calcium: 9.6 mg/dL (ref 8.7–10.2)
Chloride: 99 mmol/L (ref 96–106)
GFR calc Af Amer: 98 mL/min/{1.73_m2} (ref >59)
GFR calc non Af Amer: 85 mL/min/{1.73_m2} (ref >59)
GLUCOSE: 85 mg/dL (ref 65–99)
Globulin, Total: 2.5 g/dL (ref 1.5–4.5)
Potassium: 3.9 mmol/L (ref 3.5–5.2)
SODIUM: 139 mmol/L (ref 134–144)
Total Protein: 7 g/dL (ref 6.0–8.5)

## 2016-05-30 LAB — HEPATITIS B SURFACE ANTIGEN: HEP B S AG: NEGATIVE

## 2016-05-30 LAB — RPR: RPR Ser Ql: NONREACTIVE

## 2016-05-30 LAB — HEPATITIS C ANTIBODY: Hep C Virus Ab: <0.1 s/co ratio (ref 0.0–0.9)

## 2016-05-30 LAB — HIV ANTIBODY (ROUTINE TESTING W REFLEX): HIV Screen 4th Generation wRfx: NONREACTIVE

## 2016-05-30 LAB — TSH: TSH: 1.9 u[IU]/mL (ref 0.450–4.500)

## 2016-06-04 LAB — CYTOLOGY - PAP
Chlamydia: NEGATIVE
DIAGNOSIS: NEGATIVE
HPV: NOT DETECTED
NEISSERIA GONORRHEA: NEGATIVE

## 2016-06-24 ENCOUNTER — Encounter: Payer: Self-pay | Admitting: *Deleted

## 2016-08-26 ENCOUNTER — Encounter: Payer: Self-pay | Admitting: Obstetrics & Gynecology

## 2016-08-26 DIAGNOSIS — J3089 Other allergic rhinitis: Secondary | ICD-10-CM

## 2016-08-26 MED ORDER — FEXOFENADINE HCL 180 MG PO TABS: 180.0000 mg | ORAL_TABLET | Freq: Every day | ORAL | 4 refills | Status: DC

## 2016-10-19 ENCOUNTER — Encounter: Payer: Self-pay | Admitting: Obstetrics & Gynecology

## 2016-10-20 ENCOUNTER — Other Ambulatory Visit: Payer: Self-pay

## 2016-10-20 DIAGNOSIS — F419 Anxiety disorder, unspecified: Secondary | ICD-10-CM

## 2016-10-20 DIAGNOSIS — J3089 Other allergic rhinitis: Secondary | ICD-10-CM

## 2016-10-20 MED ORDER — FEXOFENADINE HCL 180 MG PO TABS: 180.0000 mg | ORAL_TABLET | Freq: Every day | ORAL | 3 refills | Status: DC

## 2016-10-20 MED ORDER — ALPRAZOLAM 0.5 MG PO TABS: 0.5000 mg | ORAL_TABLET | Freq: Every evening | ORAL | 1 refills | Status: DC | PRN

## 2016-10-20 NOTE — Telephone Encounter (Signed)
Refill request for xanax and allegra.  Ok per Dr Shawnie PonsPratt.

## 2017-01-26 ENCOUNTER — Encounter: Payer: Self-pay | Admitting: Obstetrics & Gynecology

## 2017-01-27 ENCOUNTER — Other Ambulatory Visit: Payer: Self-pay | Admitting: *Deleted

## 2017-01-27 DIAGNOSIS — F419 Anxiety disorder, unspecified: Secondary | ICD-10-CM

## 2017-01-27 MED ORDER — ALPRAZOLAM 0.5 MG PO TABS: 0.5000 mg | ORAL_TABLET | Freq: Every evening | ORAL | 1 refills | Status: AC | PRN

## 2017-01-27 MED ORDER — HYDROCORTISONE ACE-PRAMOXINE 1-1 % RE FOAM: 1.0000 | Freq: Two times a day (BID) | RECTAL | 0 refills | Status: DC

## 2017-02-09 ENCOUNTER — Telehealth: Payer: Self-pay | Admitting: *Deleted

## 2017-02-09 ENCOUNTER — Other Ambulatory Visit: Payer: Self-pay | Admitting: Family Medicine

## 2017-02-09 DIAGNOSIS — Z3041 Encounter for surveillance of contraceptive pills: Secondary | ICD-10-CM

## 2017-02-09 MED ORDER — HYDROCORTISONE ACE-PRAMOXINE 1-1 % RE FOAM: 1.0000 | Freq: Two times a day (BID) | RECTAL | 0 refills | Status: DC

## 2017-02-09 NOTE — Telephone Encounter (Signed)
-----   Message from Allie BossierMyra C Dove, MD sent at 02/09/2017 10:08 AM EST ----- She can certainly have proctofoam, but she will need her primary care doc to manage her xanax. Thanks

## 2017-05-11 ENCOUNTER — Telehealth: Payer: Self-pay | Admitting: *Deleted

## 2017-05-11 NOTE — Telephone Encounter (Signed)
Pt called in stating she had her cycle from 04/29/17 - 05/04/17. She has not had any bleeding since 05/04/17 until last night. Last night she started passing large blood clots and she has to change super max tampon and maxi pad about every hour. She states she missed one OCP and had intercourse and few days after her missed pill. Advised pt to be evaluated at ED for excessive vaginal bleeding. Pt expressed understanding.

## 2017-05-14 ENCOUNTER — Encounter: Payer: Self-pay | Admitting: Obstetrics & Gynecology

## 2017-05-14 ENCOUNTER — Ambulatory Visit (INDEPENDENT_AMBULATORY_CARE_PROVIDER_SITE_OTHER): Payer: PRIVATE HEALTH INSURANCE | Admitting: Obstetrics & Gynecology

## 2017-05-14 ENCOUNTER — Other Ambulatory Visit (HOSPITAL_COMMUNITY)
Admission: RE | Admit: 2017-05-14 | Discharge: 2017-05-14 | Disposition: A | Discharge status: Home / Self Care | Payer: PRIVATE HEALTH INSURANCE | Source: Ambulatory Visit | Attending: Obstetrics & Gynecology | Admitting: Obstetrics & Gynecology

## 2017-05-14 VITALS — BP 105/69 | HR 67 | Wt 227.1 lb

## 2017-05-14 DIAGNOSIS — N852 Hypertrophy of uterus: Secondary | ICD-10-CM

## 2017-05-14 DIAGNOSIS — N939 Abnormal uterine and vaginal bleeding, unspecified: Secondary | ICD-10-CM | POA: Diagnosis not present

## 2017-05-14 DIAGNOSIS — Z3202 Encounter for pregnancy test, result negative: Secondary | ICD-10-CM | POA: Diagnosis not present

## 2017-05-14 LAB — POCT URINE PREGNANCY: Preg Test, Ur: NEGATIVE

## 2017-05-14 MED ORDER — NORGESTIMATE-ETH ESTRADIOL 0.25-35 MG-MCG PO TABS: 1.0000 | ORAL_TABLET | Freq: Every day | ORAL | 9 refills | Status: DC

## 2017-05-14 NOTE — Patient Instructions (Signed)
Dysfunctional Uterine Bleeding °Dysfunctional uterine bleeding is abnormal bleeding from the uterus. Dysfunctional uterine bleeding includes: °· A period that comes earlier or later than usual. °· A period that is lighter, heavier, or has blood clots. °· Bleeding between periods. °· Skipping one or more periods. °· Bleeding after sexual intercourse. °· Bleeding after menopause. ° °Follow these instructions at home: °Pay attention to any changes in your symptoms. Follow these instructions to help with your condition: °Eating and drinking °· Eat well-balanced meals. Include foods that are high in iron, such as liver, meat, shellfish, green leafy vegetables, and eggs. °· If you become constipated: °? Drink plenty of water. °? Eat fruits and vegetables that are high in water and fiber, such as spinach, carrots, raspberries, apples, and mango. °Medicines °· Take over-the-counter and prescription medicines only as told by your health care provider. °· Do not change medicines without talking with your health care provider. °· Aspirin or medicines that contain aspirin may make the bleeding worse. Do not take those medicines: °? During the week before your period. °? During your period. °· If you were prescribed iron pills, take them as told by your health care provider. Iron pills help to replace iron that your body loses because of this condition. °Activity °· If you need to change your sanitary pad or tampon more than one time every 2 hours: °? Lie in bed with your feet raised (elevated). °? Place a cold pack on your lower abdomen. °? Rest as much as possible until the bleeding stops or slows down. °· Do not try to lose weight until the bleeding has stopped and your blood iron level is back to normal. °Other Instructions °· For two months, write down: °? When your period starts. °? When your period ends. °? When any abnormal bleeding occurs. °? What problems you notice. °· Keep all follow up visits as told by your health  care provider. This is important. °Contact a health care provider if: °· You get light-headed or weak. °· You have nausea and vomiting. °· You cannot eat or drink without vomiting. °· You feel dizzy or have diarrhea while you are taking medicines. °· You are taking birth control pills or hormones, and you want to change them or stop taking them. °Get help right away if: °· You develop a fever or chills. °· You need to change your sanitary pad or tampon more than one time per hour. °· Your bleeding becomes heavier, or your flow contains clots more often. °· You develop pain in your abdomen. °· You lose consciousness. °· You develop a rash. °This information is not intended to replace advice given to you by your health care provider. Make sure you discuss any questions you have with your health care provider. °Document Released: 02/28/2000 Document Revised: 08/08/2015 Document Reviewed: 05/28/2014 °Elsevier Interactive Patient Education © 2018 Elsevier Inc. ° °

## 2017-05-14 NOTE — Progress Notes (Signed)
GYNECOLOGY OFFICE VISIT NOTE  History:  32 y.o. [id number] here today for discussion of one episode of prolonged bleeding.  She is currently on Sprintec for contraception, has been on this for years.   She had her usual withdrawal bleeding from 04/29/17 - 05/04/17. Then she restarted bleeding on 05/10/17, passing large blood clots and having to change super tampon and maxi pad about every hour. Associated with cramping. She states she missed one OCP during that week and had intercourse and few days after her missed pill.  Home UPT was negative. On 05/11/17, she went to [facility] ED and was evaluated. Had hemoglobin of 14, negative UA.  Told to follow up with GYN provider. Today, she reports that the bleeding is decreasing a little but still present. No presyncopal symptoms.  Currently she denies any abnormal vaginal discharge, pelvic pain or other concerns. No recent illnesses, stressors or other life activities.   Past Medical History:  Diagnosis Date  . Abnormal Pap smear   . Abnormal uterine bleeding (AUB)   . Asthma   . Chlamydia     Past Surgical History:  Procedure Laterality Date  . COLPOSCOPY  05/06/10  . VSD REPAIR      The following portions of the patient's history were reviewed and updated as appropriate: allergies, current medications, past family history, past medical history, past social history, past surgical history and problem list.   Health Maintenance:  Normal pap and negative HRHPV on 05/29/2016.    Review of Systems:  Pertinent items noted in HPI and remainder of comprehensive ROS otherwise negative.  Objective:  Physical Exam BP 105/69   Pulse 67   Wt 227 lb 1.6 oz (103 kg)   BMI 34.03 kg/m  CONSTITUTIONAL: Well-developed, well-nourished female in no acute distress.  HENT:  Normocephalic, atraumatic. External right and left ear normal. Oropharynx is clear and moist EYES: Conjunctivae and EOM are normal. Pupils are equal, round, and reactive to light. No scleral  icterus.  NECK: Normal range of motion, supple, no masses SKIN: Skin is warm and dry. No rash noted. Not diaphoretic. No erythema. No pallor. NEUROLOGIC: Alert and oriented to person, place, and time. Normal reflexes, muscle tone coordination. No cranial nerve deficit noted. PSYCHIATRIC: Normal mood and affect. Normal behavior. Normal judgment and thought content. CARDIOVASCULAR: Normal heart rate noted RESPIRATORY: Effort and breath sounds normal, no problems with respiration noted ABDOMEN: Soft, no distention noted.   PELVIC: Normal appearing external genitalia; normal appearing vaginal mucosa and cervix.  Moderate bloody discharge noted, no active bleeding.  Enlarged uterine size palpated, no other palpable masses, no uterine or adnexal tenderness. MUSCULOSKELETAL: Normal range of motion. No edema noted.  Labs and Imaging Results for orders placed or performed in visit on 05/14/17 (from the past 24 hour(s))  POCT urine pregnancy     Status: None   Collection Time: 05/14/17  8:33 AM  Result Value Ref Range   Preg Test, Ur Negative Negative    Assessment & Plan:  1. Abnormal uterine bleeding (AUB) 2. Enlarged uterus Likely an episode of anovulatory bleeding.  OCP taper (2 pills a day until current pack finishes then one pill a day) prescribed to help stop with the bleeding; refills ordered. Infection screen pending.Normal TSH last year. Normal CBC with hemoglobin of 14 on 05/11/17. Given enlarged uterus on exam, ultrasound was ordered.   - POCT urine pregnancy - US PELVIC COMPLETE WITH TRANSVAGINAL; Future - Cervicovaginal ancillary only - norgestimate-ethinyl estradiol (SPRINTEC 28) 0.25-35 MG-MCG  tablet; Take 1 tablet by mouth daily.  Dispense: 3 Package; Refill: 9 Will follow up results and manage accordingly.  Routine preventative health maintenance measures emphasized. Please refer to After Visit Summary for other counseling recommendations.   Return if symptoms worsen or fail to  improve.   Total face-to-face time with patient: 20 minutes.  Over 50% of encounter was spent on counseling and coordination of care.   Jaynie CollinsUGONNA  Gracin Mcpartland, MD, FACOG Obstetrician & Gynecologist, [facility], [facility]

## 2017-05-17 LAB — CERVICOVAGINAL ANCILLARY ONLY
Bacterial vaginitis: NEGATIVE
Candida vaginitis: NEGATIVE
Chlamydia: NEGATIVE
NEISSERIA GONORRHEA: NEGATIVE
TRICH (WINDOWPATH): NEGATIVE

## 2017-05-24 ENCOUNTER — Encounter: Payer: Self-pay | Admitting: *Deleted

## 2017-05-24 ENCOUNTER — Encounter: Payer: Self-pay | Admitting: Obstetrics & Gynecology

## 2017-05-24 DIAGNOSIS — N852 Hypertrophy of uterus: Secondary | ICD-10-CM

## 2017-05-24 DIAGNOSIS — N939 Abnormal uterine and vaginal bleeding, unspecified: Secondary | ICD-10-CM

## 2019-02-22 ENCOUNTER — Ambulatory Visit: Payer: PRIVATE HEALTH INSURANCE | Admitting: Advanced Practice Midwife

## 2019-03-20 ENCOUNTER — Other Ambulatory Visit: Payer: Self-pay | Admitting: Nurse Practitioner

## 2019-03-20 DIAGNOSIS — K8000 Calculus of gallbladder with acute cholecystitis without obstruction: Secondary | ICD-10-CM

## 2019-03-20 DIAGNOSIS — R93421 Abnormal radiologic findings on diagnostic imaging of right kidney: Secondary | ICD-10-CM

## 2019-03-23 ENCOUNTER — Ambulatory Visit
Admission: RE | Admit: 2019-03-23 | Discharge: 2019-03-23 | Disposition: A | Discharge status: Home / Self Care | Payer: 59 | Source: Ambulatory Visit | Attending: Nurse Practitioner | Admitting: Nurse Practitioner

## 2019-03-23 DIAGNOSIS — K8000 Calculus of gallbladder with acute cholecystitis without obstruction: Secondary | ICD-10-CM

## 2019-03-23 DIAGNOSIS — R93421 Abnormal radiologic findings on diagnostic imaging of right kidney: Secondary | ICD-10-CM

## 2019-04-13 ENCOUNTER — Ambulatory Visit: Payer: PRIVATE HEALTH INSURANCE | Admitting: Family Medicine

## 2019-04-27 ENCOUNTER — Ambulatory Visit: Payer: 59 | Admitting: Family Medicine

## 2020-07-30 ENCOUNTER — Ambulatory Visit: Payer: 59 | Admitting: Obstetrics & Gynecology

## 2020-08-21 ENCOUNTER — Other Ambulatory Visit: Payer: Self-pay

## 2020-08-21 ENCOUNTER — Encounter: Payer: Self-pay | Admitting: Obstetrics and Gynecology

## 2020-08-21 ENCOUNTER — Other Ambulatory Visit (HOSPITAL_COMMUNITY)
Admission: RE | Admit: 2020-08-21 | Discharge: 2020-08-21 | Disposition: A | Discharge status: Home / Self Care | Payer: 59 | Source: Ambulatory Visit | Attending: Obstetrics & Gynecology | Admitting: Obstetrics & Gynecology

## 2020-08-21 ENCOUNTER — Ambulatory Visit (INDEPENDENT_AMBULATORY_CARE_PROVIDER_SITE_OTHER): Payer: 59 | Admitting: Obstetrics and Gynecology

## 2020-08-21 VITALS — BP 133/81 | HR 71 | Wt 283.0 lb

## 2020-08-21 DIAGNOSIS — Z01419 Encounter for gynecological examination (general) (routine) without abnormal findings: Secondary | ICD-10-CM | POA: Diagnosis not present

## 2020-08-21 NOTE — Progress Notes (Signed)
Periods have become more irregular over the last 3-4 months, pt thinks due to weight gain ??

## 2020-08-21 NOTE — Progress Notes (Signed)
ul   GYNECOLOGY ANNUAL PREVENTATIVE CARE ENCOUNTER NOTE  History:     Sydney West is a 35 y.o. [phone] female here for a routine annual gynecologic exam.  Current complaints: since 2015 she has gained 100 lbs, abnormal menstrual cycles since weight gain. Reports not having regular monthly periods.   In August of 2020 she stopped smoking (yay).  She is at her heaviest weight she has been. She voices pregnancy, and has been trying to conceive for a year.    Denies abnormal vaginal bleeding, discharge, pelvic pain, problems with intercourse or other gynecologic concerns.    Grandmother had breast cancer, she would like baseline mammogram.    Gynecologic History Patient's last menstrual period was 08/04/2020 (exact date). Contraception: none Last Pap: 2018.  Results were: normal with negative HPV Last mammogram: NA  Obstetric History OB History  Gravida Para Term Preterm AB Living  3 2 2  0 1 2  SAB IAB Ectopic Multiple Live Births  0 1 0 0 2    # Outcome Date GA Lbr Len/2nd Weight Sex Delivery Anes PTL Lv  3 IAB 12/21/13 [redacted]w[redacted]d    TAB     2 Term      Vag-Spont   LIV  1 Term      Vag-Spont   LIV    Past Medical History:  Diagnosis Date   Abnormal Pap smear    Abnormal uterine bleeding (AUB)    Asthma    Chlamydia     Past Surgical History:  Procedure Laterality Date   COLPOSCOPY  05/06/10   VSD REPAIR      Current Outpatient Medications on File Prior to Visit  Medication Sig Dispense Refill   ALPRAZolam (XANAX) 0.5 MG tablet Take 1 tablet (0.5 mg total) at bedtime as needed by mouth. for sleep 30 tablet 1   Fluticasone-Salmeterol (ADVAIR) 250-50 MCG/DOSE AEPB Inhale 1 puff into the lungs 2 (two) times daily.     Multiple Vitamins-Minerals (MULTIVITAMIN PO) Take by mouth.     norgestimate-ethinyl estradiol (SPRINTEC 28) 0.25-35 MG-MCG tablet Take 1 tablet by mouth daily. (Patient not taking: Reported on 08/21/2020) 3 Package 9   No current facility-administered medications on  file prior to visit.    No Known Allergies  Social History:  reports that she has quit smoking. Her smoking use included cigarettes. She smoked 0.00 packs per day. She has never used smokeless tobacco. She reports current alcohol use. She reports that she does not use drugs.  Family History  Problem Relation Age of Onset   Breast cancer Maternal Grandmother 12   Lung cancer Paternal Grandfather 28   Lung cancer Maternal Grandfather 36   Breast cancer Paternal Grandmother 37    The following portions of the patient's history were reviewed and updated as appropriate: allergies, current medications, past family history, past medical history, past social history, past surgical history and problem list.  Review of Systems Pertinent items noted in HPI and remainder of comprehensive ROS otherwise negative.  Physical Exam:  BP 133/81   Pulse 71   Wt 283 lb (128.4 kg)   LMP 08/04/2020 (Exact Date)   BMI 42.40 kg/m  CONSTITUTIONAL: Well-developed, well-nourished female in no acute distress.  HENT:  Normocephalic, atraumatic, External right and left ear normal.  EYES: Conjunctivae and EOM are normal. Pupils are equal, round, and reactive to light. No scleral icterus.  NECK: Normal range of motion, supple, no masses.  Normal thyroid.  SKIN: Skin is warm and dry. No  rash noted. Not diaphoretic. No erythema. No pallor. MUSCULOSKELETAL: Normal range of motion. No tenderness.  No cyanosis, clubbing, or edema. NEUROLOGIC: Alert and oriented to person, place, and time. Normal reflexes, muscle tone coordination.  PSYCHIATRIC: Normal mood and affect. Normal behavior. Normal judgment and thought content. CARDIOVASCULAR: Normal heart rate noted, regular rhythm RESPIRATORY: Clear to auscultation bilaterally. Effort and breath sounds normal, no problems with respiration noted. BREASTS: Symmetric in size. No masses, tenderness, skin changes, nipple drainage, or lymphadenopathy bilaterally. Performed in  the presence of a chaperone. ABDOMEN: Soft, no distention noted.  No tenderness, rebound or guarding.  PELVIC: Normal appearing external genitalia and urethral meatus; normal appearing vaginal mucosa and cervix.  No abnormal discharge noted.  Pap smear obtained.  Normal uterine size, no other palpable masses, no uterine or adnexal tenderness.  Performed in the presence of a chaperone.   Assessment and Plan:   1. Women's annual routine gynecological examination - Cytology - PAP - CBC - TSH - Hemoglobin A1c - Comprehensive metabolic panel - MM Digital Screening; Future - baseline.   Will follow up results of pap smear and manage accordingly. Mammogram scheduled Routine preventative health maintenance measures emphasized. Please refer to After Visit Summary for other counseling recommendations.      Ann Groeneveld, Harolyn Rutherford, NP [facility] [employer], [facility]

## 2020-08-22 LAB — COMPREHENSIVE METABOLIC PANEL
ALT: 21 IU/L (ref 0–32)
AST: 16 IU/L (ref 0–40)
Albumin/Globulin Ratio: 1.7 (ref 1.2–2.2)
Albumin: 4.7 g/dL (ref 3.8–4.8)
Alkaline Phosphatase: 102 IU/L (ref 44–121)
BUN/Creatinine Ratio: 13 (ref 9–23)
BUN: 11 mg/dL (ref 6–20)
Bilirubin Total: 0.2 mg/dL (ref 0.0–1.2)
CO2: 21 mmol/L (ref 20–29)
Calcium: 9.4 mg/dL (ref 8.7–10.2)
Chloride: 102 mmol/L (ref 96–106)
Creatinine, Ser: 0.88 mg/dL (ref 0.57–1.00)
Globulin, Total: 2.7 g/dL (ref 1.5–4.5)
Glucose: 91 mg/dL (ref 65–99)
Potassium: 4.1 mmol/L (ref 3.5–5.2)
Sodium: 137 mmol/L (ref 134–144)
Total Protein: 7.4 g/dL (ref 6.0–8.5)
eGFR: 87 mL/min/{1.73_m2} (ref >59)

## 2020-08-22 LAB — CBC
Hematocrit: 42.2 % (ref 34.0–46.6)
Hemoglobin: 13.6 g/dL (ref 11.1–15.9)
MCH: 26.4 pg — ABNORMAL LOW (ref 26.6–33.0)
MCHC: 32.2 g/dL (ref 31.5–35.7)
MCV: 82 fL (ref 79–97)
Platelets: 371 10*3/uL (ref 150–450)
RBC: 5.16 x10E6/uL (ref 3.77–5.28)
RDW: 13.2 % (ref 11.7–15.4)
WBC: 6.7 10*3/uL (ref 3.4–10.8)

## 2020-08-22 LAB — TSH: TSH: 1.47 u[IU]/mL (ref 0.450–4.500)

## 2020-08-22 LAB — HEMOGLOBIN A1C
Est. average glucose Bld gHb Est-mCnc: 114 mg/dL
Hgb A1c MFr Bld: 5.6 % (ref 4.8–5.6)

## 2020-08-26 LAB — CYTOLOGY - PAP
Adequacy: ABSENT
Comment: NEGATIVE
Diagnosis: NEGATIVE
High risk HPV: NEGATIVE

## 2020-08-27 ENCOUNTER — Telehealth: Payer: Self-pay | Admitting: Obstetrics and Gynecology

## 2020-08-27 NOTE — Telephone Encounter (Signed)
Reviewed pap and A1C results.  Offered metformin for the treatment of pre-diabetes. Patient declined. She would like to continue to work on her weight loss.   Duane Lope, NP 08/27/2020 10:38 AM

## 2020-11-26 ENCOUNTER — Ambulatory Visit
Admission: RE | Admit: 2020-11-26 | Discharge: 2020-11-26 | Disposition: A | Discharge status: Home / Self Care | Payer: 59 | Source: Ambulatory Visit | Attending: Obstetrics and Gynecology | Admitting: Obstetrics and Gynecology

## 2020-11-26 ENCOUNTER — Other Ambulatory Visit: Payer: Self-pay

## 2020-11-26 DIAGNOSIS — Z01419 Encounter for gynecological examination (general) (routine) without abnormal findings: Secondary | ICD-10-CM

## 2022-10-06 ENCOUNTER — Ambulatory Visit (INDEPENDENT_AMBULATORY_CARE_PROVIDER_SITE_OTHER): Payer: 59 | Admitting: Obstetrics & Gynecology

## 2022-10-06 ENCOUNTER — Encounter: Payer: Self-pay | Admitting: Obstetrics & Gynecology

## 2022-10-06 VITALS — BP 120/82 | HR 65 | Ht 69.0 in | Wt 280.0 lb

## 2022-10-06 DIAGNOSIS — Z1339 Encounter for screening examination for other mental health and behavioral disorders: Secondary | ICD-10-CM | POA: Diagnosis not present

## 2022-10-06 DIAGNOSIS — Z113 Encounter for screening for infections with a predominantly sexual mode of transmission: Secondary | ICD-10-CM

## 2022-10-06 DIAGNOSIS — Z01419 Encounter for gynecological examination (general) (routine) without abnormal findings: Secondary | ICD-10-CM

## 2022-10-06 NOTE — Progress Notes (Signed)
GYNECOLOGY ANNUAL PREVENTATIVE CARE ENCOUNTER NOTE  History:     Sydney West is a 37 y.o. [phone] female here for a routine annual gynecologic exam.  Current complaints: upper abdominal pain and back pain that comes and goes, has history of gallstones and has discussed surgery previously.  Pain is dull, not severe.   Denies abnormal vaginal bleeding, discharge, pelvic pain, problems with intercourse or other gynecologic concerns. Desires annual preventative healthcare maintenance labs and annual STI screen.    Gynecologic History Patient's last menstrual period was 09/30/2022. Contraception: none currently, not sexually active. Will use condoms if sexually active. Last Pap: 08/21/2020. Result was normal with negative HPV Last Mammogram: 11/2020.  Result was normal. MGM has breast cancer, no first degree relative.   Obstetric History OB History  Gravida Para Term Preterm AB Living  3 2 2  0 1 2  SAB IAB Ectopic Multiple Live Births  0 1 0 0 2    # Outcome Date GA Lbr Len/2nd Weight Sex Type Anes PTL Lv  3 IAB 12/21/13 [redacted]w[redacted]d    TAB     2 Term      Vag-Spont   LIV  1 Term      Vag-Spont   LIV    Past Medical History:  Diagnosis Date   Abnormal Pap smear    Abnormal uterine bleeding (AUB)    Asthma    Chlamydia     Past Surgical History:  Procedure Laterality Date   COLPOSCOPY  05/06/10   VSD REPAIR      Current Outpatient Medications on File Prior to Visit  Medication Sig Dispense Refill   ALPRAZolam (XANAX) 0.5 MG tablet Take 1 tablet (0.5 mg total) at bedtime as needed by mouth. for sleep 30 tablet 1   Fluticasone-Salmeterol (ADVAIR) 250-50 MCG/DOSE AEPB Inhale 1 puff into the lungs 2 (two) times daily.     Multiple Vitamins-Minerals (MULTIVITAMIN PO) Take by mouth.     tirzepatide (MOUNJARO) 2.5 MG/0.5ML Pen Inject 2.5 mg into the skin once a week. Patient reports this was prescribed for her father but she takes it     No current facility-administered medications on  file prior to visit.    No Known Allergies  Social History:  reports that she has quit smoking. Her smoking use included cigarettes. She has never used smokeless tobacco. She reports current alcohol use. She reports that she does not use drugs.  Family History  Problem Relation Age of Onset   Breast cancer Maternal Grandmother 19   Lung cancer Paternal Grandfather 67   Lung cancer Maternal Grandfather 36   Breast cancer Paternal Grandmother 62    The following portions of the patient's history were reviewed and updated as appropriate: allergies, current medications, past family history, past medical history, past social history, past surgical history and problem list.  Review of Systems Pertinent items noted in HPI and remainder of comprehensive ROS otherwise negative.  Physical Exam:  BP 120/82   Pulse 65   Ht 5\' 9"  (1.753 m)   Wt 280 lb (127 kg)   LMP 09/30/2022   BMI 41.35 kg/m  CONSTITUTIONAL: Well-developed, well-nourished female in no acute distress.  HENT:  Normocephalic, atraumatic, External right and left ear normal.  EYES: Conjunctivae and EOM are normal. Pupils are equal, round, and reactive to light. No scleral icterus.  NECK: Normal range of motion, supple, no masses.  Normal thyroid.  SKIN: Skin is warm and dry. No rash noted. Not diaphoretic.  No erythema. No pallor. MUSCULOSKELETAL: Normal range of motion. No tenderness.  No cyanosis, clubbing, or edema.   NEUROLOGIC: Alert and oriented to person, place, and time. Normal reflexes, muscle tone coordination. No cranial nerve deficit noted. PSYCHIATRIC: Normal mood and affect. Normal behavior. Normal judgment and thought content. CARDIOVASCULAR: Normal heart rate noted, regular rhythm RESPIRATORY: Clear to auscultation bilaterally. Effort and breath sounds normal, no problems with respiration noted. BREASTS: Symmetric in size. No masses, tenderness, skin changes, nipple drainage, or lymphadenopathy bilaterally.   Performed in the presence of a chaperone. ABDOMEN: Soft, obese, no distention appreciated.  No tenderness, rebound or guarding.  PELVIC: Normal appearing external genitalia and urethral meatus; normal appearing vaginal mucosa and cervix.  Normal appearing discharge.  Pap smear obtained.  Unable to palpate uterus or adnexa secondary to habitus.  Performed in the presence of a chaperone.   Assessment and Plan:     1. Routine screening for STI (sexually transmitted infection) STI screen done, will follow up results and manage accordingly. - Cytology - PAP ancillary testing - RPR+HBsAg+HCVAb+HIV  2. Well woman exam with routine gynecological exam - Cytology - PAP - CBC - Comprehensive metabolic panel - Lipid panel - Hemoglobin A1c - TSH - Lipase - Amylase Will follow up results of pap smear and preventative healthcare maintenance labs and manage accordingly. Normal breast examination today, she was advised to perform periodic self breast examinations.  Mammogram was normal in 2022, screening mammogram at age 22.  No other scans indicated due to history of MGM having breast cancer (not first degree relative).  Discussed genetic testing, she is uninterested for now. Routine preventative health maintenance measures emphasized. Please refer to After Visit Summary for other counseling recommendations.      Jaynie Collins, MD, FACOG Obstetrician & Gynecologist, [facility] for [employer], [facility]

## 2022-10-07 LAB — COMPREHENSIVE METABOLIC PANEL
ALT: 26 IU/L (ref 0–32)
AST: 18 IU/L (ref 0–40)
Albumin: 4.6 g/dL (ref 3.9–4.9)
Alkaline Phosphatase: 97 IU/L (ref 44–121)
BUN/Creatinine Ratio: 13 (ref 9–23)
BUN: 11 mg/dL (ref 6–20)
Bilirubin Total: 0.3 mg/dL (ref 0.0–1.2)
CO2: 22 mmol/L (ref 20–29)
Calcium: 9.6 mg/dL (ref 8.7–10.2)
Chloride: 103 mmol/L (ref 96–106)
Creatinine, Ser: 0.88 mg/dL (ref 0.57–1.00)
Globulin, Total: 2.5 g/dL (ref 1.5–4.5)
Glucose: 94 mg/dL (ref 70–99)
Potassium: 4.4 mmol/L (ref 3.5–5.2)
Sodium: 141 mmol/L (ref 134–144)
Total Protein: 7.1 g/dL (ref 6.0–8.5)
eGFR: 86 mL/min/{1.73_m2} (ref >59)

## 2022-10-07 LAB — TSH: TSH: 1.66 u[IU]/mL (ref 0.450–4.500)

## 2022-10-07 LAB — RPR+HBSAG+HCVAB+...
HIV Screen 4th Generation wRfx: NONREACTIVE
Hep C Virus Ab: NONREACTIVE
Hepatitis B Surface Ag: NEGATIVE
RPR Ser Ql: NONREACTIVE

## 2022-10-07 LAB — AMYLASE: Amylase: 43 U/L (ref 31–110)

## 2022-10-07 LAB — LIPID PANEL
Chol/HDL Ratio: 3.3 ratio (ref 0.0–4.4)
Cholesterol, Total: 181 mg/dL (ref 100–199)
HDL: 55 mg/dL (ref >39)
LDL Chol Calc (NIH): 102 mg/dL — ABNORMAL HIGH (ref 0–99)
Triglycerides: 137 mg/dL (ref 0–149)
VLDL Cholesterol Cal: 24 mg/dL (ref 5–40)

## 2022-10-07 LAB — CBC
Hematocrit: 44.4 % (ref 34.0–46.6)
Hemoglobin: 14.2 g/dL (ref 11.1–15.9)
MCH: 26.5 pg — ABNORMAL LOW (ref 26.6–33.0)
MCHC: 32 g/dL (ref 31.5–35.7)
MCV: 83 fL (ref 79–97)
Platelets: 432 10*3/uL (ref 150–450)
RBC: 5.36 x10E6/uL — ABNORMAL HIGH (ref 3.77–5.28)
RDW: 13.8 % (ref 11.7–15.4)
WBC: 5.8 10*3/uL (ref 3.4–10.8)

## 2022-10-07 LAB — HEMOGLOBIN A1C
Est. average glucose Bld gHb Est-mCnc: 123 mg/dL
Hgb A1c MFr Bld: 5.9 % — ABNORMAL HIGH (ref 4.8–5.6)

## 2022-10-07 LAB — LIPASE: Lipase: 18 U/L (ref 14–72)

## 2022-10-13 ENCOUNTER — Telehealth: Payer: Self-pay | Admitting: *Deleted

## 2022-10-13 NOTE — Telephone Encounter (Signed)
Called pt to go over pap results and needing to repeat he pap. Appt made for pap only visit

## 2022-10-13 NOTE — Telephone Encounter (Signed)
-----   Message from Jaynie Collins sent at 10/13/2022  7:51 AM EDT ----- Pap smear sample did not have adequate cells (I think patient was on her period during this time).  Needs recollection, can come in for a a repeat pap smear.  Please call to inform patient of results and recommendations.  Jaynie Collins, MD

## 2022-11-11 ENCOUNTER — Ambulatory Visit (INDEPENDENT_AMBULATORY_CARE_PROVIDER_SITE_OTHER): Payer: 59 | Admitting: Obstetrics & Gynecology

## 2022-11-11 ENCOUNTER — Other Ambulatory Visit (HOSPITAL_COMMUNITY)
Admission: RE | Admit: 2022-11-11 | Discharge: 2022-11-11 | Disposition: A | Discharge status: Home / Self Care | Payer: 59 | Source: Ambulatory Visit | Attending: Obstetrics & Gynecology | Admitting: Obstetrics & Gynecology

## 2022-11-11 ENCOUNTER — Encounter: Payer: Self-pay | Admitting: Obstetrics & Gynecology

## 2022-11-11 VITALS — BP 138/74 | HR 76 | Wt 284.4 lb

## 2022-11-11 DIAGNOSIS — R87615 Unsatisfactory cytologic smear of cervix: Secondary | ICD-10-CM | POA: Insufficient documentation

## 2022-11-11 NOTE — Progress Notes (Signed)
   GYNECOLOGY OFFICE VISIT NOTE  History:   Sydney West is a 37 y.o. [id number] here today for repeat pap smear; pap during her annual exam on 10/06/22 was non diagnostic due to obscuring blood from her period. She denies any abnormal vaginal discharge, bleeding, pelvic pain or other concerns.    Past Medical History:  Diagnosis Date   Abnormal Pap smear    Abnormal uterine bleeding (AUB)    Asthma    Chlamydia     Past Surgical History:  Procedure Laterality Date   COLPOSCOPY  05/06/10   VSD REPAIR      The following portions of the patient's history were reviewed and updated as appropriate: allergies, current medications, past family history, past medical history, past social history, past surgical history and problem list.   Health Maintenance:  Normal pap and negative HRHPV on 08/21/2020.    Review of Systems:  Pertinent items noted in HPI and remainder of comprehensive ROS otherwise negative.  Physical Exam:  BP 138/74   Pulse 76   Wt 284 lb 6.4 oz (129 kg)   BMI 42.00 kg/m  CONSTITUTIONAL: Well-developed, well-nourished female in no acute distress.  HEENT:  Normocephalic, atraumatic. External right and left ear normal. No scleral icterus.  NECK: Normal range of motion, supple, no masses noted on observation SKIN: No rash noted. Not diaphoretic. No erythema. No pallor. MUSCULOSKELETAL: Normal range of motion. No edema noted. NEUROLOGIC: Alert and oriented to person, place, and time. Normal muscle tone coordination. No cranial nerve deficit noted. PSYCHIATRIC: Normal mood and affect. Normal behavior. Normal judgment and thought content. CARDIOVASCULAR: Normal heart rate noted RESPIRATORY: Effort and breath sounds normal, no problems with respiration noted ABDOMEN: No masses noted. No other overt distention noted.   PELVIC: Normal appearing external genitalia; normal urethral meatus; normal appearing vaginal mucosa and cervix.  No abnormal discharge noted.  Pap smear sample  obtained.  Performed in the presence of a chaperone     Assessment and Plan:    1. Unsatisfactory cervical Papanicolaou smear - Cytology - PAP re-done, will follow up results and manage accordingly. Routine preventative health maintenance measures emphasized. Please refer to After Visit Summary for other counseling recommendations.   Return for follow up as recommended.    I spent 15 minutes dedicated to the care of this patient including pre-visit review of records, face to face time with the patient discussing her conditions and treatments and post visit orders.    Jaynie Collins, MD, FACOG Obstetrician & Gynecologist, [facility] for [employer], [facility]

## 2022-11-19 LAB — CYTOLOGY - PAP
Comment: NEGATIVE
Diagnosis: NEGATIVE
High risk HPV: NEGATIVE

## 2023-04-07 ENCOUNTER — Other Ambulatory Visit: Payer: Self-pay | Admitting: Psychiatry

## 2023-04-07 DIAGNOSIS — N281 Cyst of kidney, acquired: Secondary | ICD-10-CM

## 2023-04-07 DIAGNOSIS — K8010 Calculus of gallbladder with chronic cholecystitis without obstruction: Secondary | ICD-10-CM

## 2023-04-09 ENCOUNTER — Ambulatory Visit
Admission: RE | Admit: 2023-04-09 | Discharge: 2023-04-09 | Disposition: A | Discharge status: Home / Self Care | Payer: BC Managed Care – PPO | Source: Ambulatory Visit | Attending: Surgery | Admitting: Surgery

## 2023-04-09 DIAGNOSIS — N281 Cyst of kidney, acquired: Secondary | ICD-10-CM

## 2023-04-09 DIAGNOSIS — K8010 Calculus of gallbladder with chronic cholecystitis without obstruction: Secondary | ICD-10-CM

## 2023-04-16 ENCOUNTER — Ambulatory Visit: Payer: Self-pay | Admitting: Surgery

## 2023-04-16 ENCOUNTER — Encounter: Payer: Self-pay | Admitting: Surgery

## 2023-04-16 NOTE — Progress Notes (Signed)
Telephone call to patient with results of USN.  Will need to have a follow up USN in 6 months to evaluate lesion on kidney, and also to check hepatic cysts.  Will plan to proceed with cholecystectomy and removal of lipoma from right flank.  Will enter orders and send to schedulers to contact patient.  Tresa Endo - please make note about follow up USN for liver and renal cysts.  Please send orders to schedulers to contact patient now.  Darnell Level, MD [facility] practice Office: [phone]

## 2023-06-24 NOTE — Patient Instructions (Signed)
 SURGICAL WAITING ROOM VISITATION  Patients having surgery or a procedure may have no more than 2 support people in the waiting area - these visitors may rotate.    Children under the age of 24 must have an adult with them who is not the patient.  Due to an increase in RSV and influenza rates and associated hospitalizations, children ages 45 and under may not visit patients in [facility] hospitals.  Visitors with respiratory illnesses are discouraged from visiting and should remain at home.  If the patient needs to stay at the hospital during part of their recovery, the visitor guidelines for inpatient rooms apply. Pre-op nurse will coordinate an appropriate time for 1 support person to accompany patient in pre-op.  This support person may not rotate.    Please refer to the [facility] website for the visitor guidelines for Inpatients (after your surgery is over and you are in a regular room).       Your procedure is scheduled on: 07/01/23   Report to [facility] Main Entrance    Report to admitting at 5:15 AM   Call this number if you have problems the morning of surgery [phone]   Do not eat food :After Midnight.   After Midnight you may have the following liquids until 4:30 AM DAY OF SURGERY  Water Non-Citrus Juices (without pulp, NO RED-Apple, White grape, White cranberry) Black Coffee (NO MILK/CREAM OR CREAMERS, sugar ok)  Clear Tea (NO MILK/CREAM OR CREAMERS, sugar ok) regular and decaf                             Plain Jell-O (NO RED)                                           Fruit ices (not with fruit pulp, NO RED)                                     Popsicles (NO RED)                                                               Sports drinks like Gatorade (NO RED)                    Oral Hygiene is also important to reduce your risk of infection.                                    Remember - BRUSH YOUR TEETH THE MORNING OF SURGERY WITH YOUR REGULAR  TOOTHPASTE   Stop all vitamins and herbal supplements 7 days before surgery.   Take these medicines the morning of surgery with A SIP OF WATER:              You may not have any metal on your body including hair pins, jewelry, and body piercing             Do not wear make-up, lotions,  powders, perfumes/cologne, or deodorant  Do not wear nail polish including gel and S&S, artificial/acrylic nails, or any other type of covering on natural nails including finger and toenails. If you have artificial nails, gel coating, etc. that needs to be removed by a nail salon please have this removed prior to surgery or surgery may need to be canceled/ delayed if the surgeon/ anesthesia feels like they are unable to be safely monitored.   Do not shave  48 hours prior to surgery.    Do not bring valuables to the hospital. [place] IS NOT             RESPONSIBLE   FOR VALUABLES.   Contacts, glasses, dentures or bridgework may not be worn into surgery.  DO NOT BRING YOUR HOME MEDICATIONS TO THE HOSPITAL. PHARMACY WILL DISPENSE MEDICATIONS LISTED ON YOUR MEDICATION LIST TO YOU DURING YOUR ADMISSION IN THE HOSPITAL!    Patients discharged on the day of surgery will not be allowed to drive home.  Someone NEEDS to stay with you for the first 24 hours after anesthesia.   Special Instructions: Bring a copy of your healthcare power of attorney and living will documents the day of surgery if you haven't scanned them before.              Please read over the following fact sheets you were given: IF YOU HAVE QUESTIONS ABOUT YOUR PRE-OP INSTRUCTIONS PLEASE CALL [phone] Rosey [place]   If you received a COVID test during your pre-op visit  it is requested that you wear a mask when out in public, stay away from anyone that may not be feeling well and notify your surgeon if you develop symptoms. If you test positive for Covid or have been in contact with anyone that has tested positive in the last 10 days please  notify you surgeon.    [place] - Preparing for Surgery Before surgery, you can play an important role.  Because skin is not sterile, your skin needs to be as free of germs as possible.  You can reduce the number of germs on your skin by washing with CHG (chlorahexidine gluconate) soap before surgery.  CHG is an antiseptic cleaner which kills germs and bonds with the skin to continue killing germs even after washing. Please DO NOT use if you have an allergy to CHG or antibacterial soaps.  If your skin becomes reddened/irritated stop using the CHG and inform your nurse when you arrive at Short Stay. Do not shave (including legs and underarms) for at least 48 hours prior to the first CHG shower.  You may shave your face/neck.  Please follow these instructions carefully:  1.  Shower with CHG Soap the night before surgery and the  morning of surgery.  2.  If you choose to wash your hair, wash your hair first as usual with your normal  shampoo.  3.  After you shampoo, rinse your hair and body thoroughly to remove the shampoo.                             4.  Use CHG as you would any other liquid soap.  You can apply chg directly to the skin and wash.  Gently with a scrungie or clean washcloth.  5.  Apply the CHG Soap to your body ONLY FROM THE NECK DOWN.   Do   not use on face/ open  Wound or open sores. Avoid contact with eyes, ears mouth and   genitals (private parts).                       Wash face,  Genitals (private parts) with your normal soap.             6.  Wash thoroughly, paying special attention to the area where your    surgery  will be performed.  7.  Thoroughly rinse your body with warm water from the neck down.  8.  DO NOT shower/wash with your normal soap after using and rinsing off the CHG Soap.                9.  Pat yourself dry with a clean towel.            10.  Wear clean pajamas.            11.  Place clean sheets on your bed the night of your first  shower and do not  sleep with pets. Day of Surgery : Do not apply any lotions/deodorants the morning of surgery.  Please wear clean clothes to the hospital/surgery center.  FAILURE TO FOLLOW THESE INSTRUCTIONS MAY RESULT IN THE CANCELLATION OF YOUR SURGERY  PATIENT SIGNATURE_________________________________  NURSE SIGNATURE__________________________________  ________________________________________________________________________

## 2023-06-24 NOTE — Progress Notes (Signed)
 COVID Vaccine received:  [x]  No []  Yes Date of any COVID positive Test in last 90 days: no PCP - Zoe Lan NP Cardiologist - n/a  Chest x-ray -  EKG -   Stress Test -  ECHO -  Cardiac Cath -   Bowel Prep - [x]  No  []   Yes ______  Pacemaker / ICD device [x]  No []  Yes   Spinal Cord Stimulator:[x]  No []  Yes       History of Sleep Apnea? [x]  No []  Yes   CPAP used?- [x]  No []  Yes    Does the patient monitor blood sugar?          [x]  No []  Yes  []  N/A  Patient has: [x]  NO Hx DM   []  Pre-DM                 []  DM1  []   DM2 Does patient have a [employer] or Dexacom? []  No []  Yes   Fasting Blood Sugar Ranges-  Checks Blood Sugar _____ times a day  GLP1 agonist / usual dose - no GLP1 instructions:  SGLT-2 inhibitors / usual dose - no SGLT-2 instructions:   Blood Thinner / Instructions:no Aspirin Instructions:no  Comments:   Activity level: Patient is ableto climb a flight of stairs without difficulty; [x]  No CP  [x]  No SOB, _   Patient can perform ADLs without assistance.   Anesthesia review:   Patient denies shortness of breath, fever, cough and chest pain at PAT appointment.  Patient verbalized understanding and agreement to the Pre-Surgical Instructions that were given to them at this PAT appointment. Patient was also educated of the need to review these PAT instructions again prior to his/her surgery.I reviewed the appropriate phone numbers to call if they have any and questions or concerns.

## 2023-06-25 ENCOUNTER — Encounter (HOSPITAL_COMMUNITY)
Admission: RE | Admit: 2023-06-25 | Discharge: 2023-06-25 | Disposition: A | Discharge status: Home / Self Care | Source: Ambulatory Visit | Attending: Surgery | Admitting: Surgery

## 2023-06-25 ENCOUNTER — Other Ambulatory Visit: Payer: Self-pay

## 2023-06-25 ENCOUNTER — Encounter (HOSPITAL_COMMUNITY): Payer: Self-pay

## 2023-06-25 VITALS — BP 142/84 | HR 70 | Temp 98.1°F | Resp 16 | Ht 68.5 in | Wt 289.0 lb

## 2023-06-25 DIAGNOSIS — Z01812 Encounter for preprocedural laboratory examination: Secondary | ICD-10-CM | POA: Diagnosis present

## 2023-06-25 DIAGNOSIS — Z01818 Encounter for other preprocedural examination: Secondary | ICD-10-CM

## 2023-06-25 HISTORY — DX: Depression, unspecified: F32.A

## 2023-06-25 HISTORY — DX: Headache, unspecified: R51.9

## 2023-06-25 HISTORY — DX: Gastro-esophageal reflux disease without esophagitis: K21.9

## 2023-06-25 HISTORY — DX: Attention-deficit hyperactivity disorder, unspecified type: F90.9

## 2023-06-25 HISTORY — DX: Anxiety disorder, unspecified: F41.9

## 2023-06-25 HISTORY — DX: Pneumonia, unspecified organism: J18.9

## 2023-06-25 LAB — CBC
HCT: 46 % (ref 36.0–46.0)
Hemoglobin: 14.2 g/dL (ref 12.0–15.0)
MCH: 26.7 pg (ref 26.0–34.0)
MCHC: 30.9 g/dL (ref 30.0–36.0)
MCV: 86.5 fL (ref 80.0–100.0)
Platelets: 376 10*3/uL (ref 150–400)
RBC: 5.32 MIL/uL — ABNORMAL HIGH (ref 3.87–5.11)
RDW: 13.4 % (ref 11.5–15.5)
WBC: 6.9 10*3/uL (ref 4.0–10.5)
nRBC: 0 % (ref 0.0–0.2)

## 2023-06-28 ENCOUNTER — Encounter (HOSPITAL_COMMUNITY): Payer: Self-pay | Admitting: Surgery

## 2023-06-28 DIAGNOSIS — K8010 Calculus of gallbladder with chronic cholecystitis without obstruction: Secondary | ICD-10-CM | POA: Diagnosis present

## 2023-06-28 DIAGNOSIS — M7989 Other specified soft tissue disorders: Secondary | ICD-10-CM | POA: Diagnosis present

## 2023-06-28 NOTE — H&P (Signed)
 REFERRING PHYSICIAN: Zoe Lan  PROVIDER: Astraea Gaughran Myra Rude, MD   Chief Complaint: New Consultation (GALLBLADDER)  History of Present Illness:  Patient is referred to our practice by Zoe Lan for surgical evaluation and management of symptomatic cholelithiasis and a right flank soft tissue mass likely representing a lipoma. Apparently the patient had previously been evaluated for this. She underwent a ultrasound examination in August 2024. This demonstrated multiple gallstones without any acute inflammatory changes. Also noted was a complex cyst of the left hepatic lobe measuring 3.1 cm. Also noted was a approximately 1 cm cyst in the superior pole of the right kidney. Follow-up ultrasound was recommended in 3 to 6 months. Patient has had intermittent pain in the right flank. On exam she was noted to have a soft tissue mass consistent with a lipoma. Patient presents today to discuss possible gallbladder surgery and concurrent removal of a right flank lipoma. She has intermittent epigastric and right upper quadrant pain associated with meals. She has had no prior abdominal surgery. There is no family history of gallbladder disease. Patient is accompanied today by her daughter.  Review of Systems: A complete review of systems was obtained from the patient. I have reviewed this information and discussed as appropriate with the patient. See HPI as well for other ROS.  Review of Systems  Constitutional: Negative.  HENT: Negative.  Eyes: Negative.  Respiratory: Negative.  Cardiovascular: Negative.  Gastrointestinal: Positive for abdominal pain and heartburn.  Genitourinary: Positive for flank pain.  Skin: Negative.  Neurological: Negative.  Endo/Heme/Allergies: Negative.  Psychiatric/Behavioral: Negative.    Medical History: Past Medical History:  Diagnosis Date  Asthma, unspecified asthma severity, unspecified whether complicated, unspecified whether persistent (HHS-HCC)    Patient Active Problem List  Diagnosis  Calculus of gallbladder with chronic cholecystitis without obstruction  Hepatic cyst  Renal cyst, right  Soft tissue mass   Past Surgical History:  Procedure Laterality Date  fractured nose    No Known Allergies  Current Outpatient Medications on File Prior to Visit  Medication Sig Dispense Refill  fluticasone propion-salmeteroL (ADVAIR DISKUS) 250-50 mcg/dose diskus inhaler Inhale 1 Puff into the lungs 2 (two) times daily   No current facility-administered medications on file prior to visit.   Family History  Problem Relation Age of Onset  Coronary Artery Disease (Blocked arteries around heart) Father  High blood pressure (Hypertension) Father  Hyperlipidemia (Elevated cholesterol) Father  Diabetes Father  Obesity Brother  High blood pressure (Hypertension) Brother    Social History   Tobacco Use  Smoking Status Former  Types: Cigarettes  Smokeless Tobacco Not on file    Social History   Socioeconomic History  Marital status: Single  Tobacco Use  Smoking status: Former  Types: Cigarettes  Vaping Use  Vaping status: Unknown  Substance and Sexual Activity  Alcohol use: Yes  Drug use: Never   Social Drivers of Architectural technologist Insecurity: Low Risk (11/09/2022)  Received from [facility]  Hunger Vital Sign  Worried About Running Out of Food in the Last Year: Never true  Ran Out of Food in the Last Year: Never true  Transportation Needs: No Transportation Needs (11/09/2022)  Received from [employer]  In the past 12 months, has lack of reliable transportation kept you from medical appointments, meetings, work or from getting things needed for daily living? : No  Housing Stability: Unknown (04/06/2023)  Housing Stability Vital Sign  Homeless in the Last Year: No   Objective:  Vitals:  BP: (!) 140/82  Pulse: 79  Temp: 36.8 C (98.2 F)  SpO2: 99%  Weight: (!) 128.2 kg (282 lb 9.6 oz)   Height: 175.3 cm (5\' 9" )  PainSc: 0-No pain   Body mass index is 41.73 kg/m.  Physical Exam   GENERAL APPEARANCE Comfortable, no acute issues Development: normal Gross deformities: none  SKIN Rash, lesions, ulcers: none Induration, erythema: none Nodules: none palpable  EYES Conjunctiva and lids: normal Pupils: equal  EARS, NOSE, MOUTH, THROAT External ears: no lesion or deformity External nose: no lesion or deformity Hearing: grossly normal  CHEST/CV Not assessed  ABDOMEN Soft without distention. No surgical wounds. No hepatosplenomegaly. No palpable masses.  GENITOURINARY/RECTAL Not assessed  MUSCULOSKELETAL Station and gait: normal Digits and nails: no clubbing or cyanosis Muscle strength: grossly normal all extremities Deformity: none Palpation in the right flank just below the costal margin is a soft tissue mass measuring approximately 4 x 6 cm. This is mildly tender. It is consistent with a lipoma.  LYMPHATIC Cervical: none palpable Supraclavicular: none palpable  PSYCHIATRIC Oriented to person, place, and time: yes Mood and affect: normal for situation Judgment and insight: appropriate for situation   Assessment and Plan:   Calculus of gallbladder with chronic cholecystitis without obstruction Hepatic cyst Renal cyst, right Soft tissue mass  Patient is referred for surgical evaluation and management of symptomatic cholelithiasis and a right flank soft tissue mass consistent with lipoma.  Patient is provided with written literature on laparoscopic gallbladder surgery to review at home.  Today we reviewed her clinical history. We discussed the results of her ultrasound. We discussed laparoscopic cholecystectomy. We discussed the risk and benefits of the surgery. We discussed the use of cholangiography. We discussed concurrently removing the soft tissue mass in the right flank. We discussed doing this as an outpatient procedure. We discussed her  recovery at home.  Prior to scheduling the patient for gallbladder surgery and removal of the lipoma from the right flank, I would like to repeat her abdominal ultrasound. We need to reevaluate the cystic mass in the left hepatic lobe as well as the 1 cm hypoechoic lesion in the right kidney. We will arrange for the study in the near future. After I have reviewed those results, I will be in touch with the patient and discussed plans for further management at that time.  We did discuss laparoscopic cholecystectomy and removal of the lipoma to be done as an outpatient surgical procedure at [facility]. The patient understands and agrees to proceed with further evaluation in anticipation of surgery.  Oralee Billow, MD [facility] practice Office: [phone]

## 2023-06-30 NOTE — Anesthesia Preprocedure Evaluation (Signed)
 Anesthesia Evaluation  Patient identified by MRN, date of birth, ID band Patient awake    Reviewed: Allergy & Precautions, H&P , NPO status , Patient's Chart, lab work & pertinent test results  Airway Mallampati: II  TM Distance: >3 FB Neck ROM: Full    Dental no notable dental hx.    Pulmonary asthma , pneumonia, former smoker   Pulmonary exam normal breath sounds clear to auscultation       Cardiovascular negative cardio ROS Normal cardiovascular exam Rhythm:Regular Rate:Normal     Neuro/Psych  Headaches PSYCHIATRIC DISORDERS Anxiety Depression    meniere's    GI/Hepatic Neg liver ROS,GERD  ,,chronic cholecystitis   Endo/Other  negative endocrine ROS    Renal/GU negative Renal ROS  negative genitourinary   Musculoskeletal negative musculoskeletal ROS (+)    Abdominal  (+) + obese  Peds negative pediatric ROS (+)  Hematology negative hematology ROS (+)   Anesthesia Other Findings   Reproductive/Obstetrics negative OB ROS                             Anesthesia Physical Anesthesia Plan  ASA: 3  Anesthesia Plan: General   Post-op Pain Management: Tylenol PO (pre-op)*   Induction: Intravenous  PONV Risk Score and Plan: 3 and Ondansetron, Dexamethasone and Midazolam  Airway Management Planned: Oral ETT  Additional Equipment: None  Intra-op Plan:   Post-operative Plan: Extubation in OR  Informed Consent: I have reviewed the patients History and Physical, chart, labs and discussed the procedure including the risks, benefits and alternatives for the proposed anesthesia with the patient or authorized representative who has indicated his/her understanding and acceptance.     Dental advisory given  Plan Discussed with: CRNA  Anesthesia Plan Comments:        Anesthesia Quick Evaluation

## 2023-07-01 ENCOUNTER — Other Ambulatory Visit: Payer: Self-pay

## 2023-07-01 ENCOUNTER — Encounter (HOSPITAL_COMMUNITY): Payer: Self-pay | Admitting: Surgery

## 2023-07-01 ENCOUNTER — Ambulatory Visit (HOSPITAL_COMMUNITY)

## 2023-07-01 ENCOUNTER — Ambulatory Visit (HOSPITAL_COMMUNITY)
Admission: RE | Admit: 2023-07-01 | Discharge: 2023-07-01 | Disposition: A | Discharge status: Home / Self Care | Payer: BC Managed Care – PPO | Attending: Surgery | Admitting: Surgery

## 2023-07-01 ENCOUNTER — Encounter (HOSPITAL_COMMUNITY)
Admission: RE | Disposition: A | Discharge status: Home / Self Care | Payer: Self-pay | Source: Home / Self Care | Attending: Surgery

## 2023-07-01 DIAGNOSIS — J45909 Unspecified asthma, uncomplicated: Secondary | ICD-10-CM | POA: Insufficient documentation

## 2023-07-01 DIAGNOSIS — M7989 Other specified soft tissue disorders: Secondary | ICD-10-CM | POA: Diagnosis present

## 2023-07-01 DIAGNOSIS — K219 Gastro-esophageal reflux disease without esophagitis: Secondary | ICD-10-CM | POA: Insufficient documentation

## 2023-07-01 DIAGNOSIS — Z87891 Personal history of nicotine dependence: Secondary | ICD-10-CM | POA: Insufficient documentation

## 2023-07-01 DIAGNOSIS — K8010 Calculus of gallbladder with chronic cholecystitis without obstruction: Secondary | ICD-10-CM | POA: Diagnosis present

## 2023-07-01 DIAGNOSIS — D171 Benign lipomatous neoplasm of skin and subcutaneous tissue of trunk: Secondary | ICD-10-CM | POA: Insufficient documentation

## 2023-07-01 LAB — POCT PREGNANCY, URINE: Preg Test, Ur: NEGATIVE

## 2023-07-01 SURGERY — LAPAROSCOPIC CHOLECYSTECTOMY WITH INTRAOPERATIVE CHOLANGIOGRAM
Anesthesia: General | Laterality: Right

## 2023-07-01 MED ORDER — MIDAZOLAM HCL 2 MG/2ML IJ SOLN
INTRAMUSCULAR | Status: DC | PRN
  Administered 2023-07-01: 2 mg via INTRAVENOUS

## 2023-07-01 MED ORDER — PHENYLEPHRINE 80 MCG/ML (10ML) SYRINGE FOR IV PUSH (FOR BLOOD PRESSURE SUPPORT)
PREFILLED_SYRINGE | INTRAVENOUS | Status: AC
  Filled 2023-07-01: qty 10

## 2023-07-01 MED ORDER — ONDANSETRON HCL 4 MG/2ML IJ SOLN
INTRAMUSCULAR | Status: DC | PRN
  Administered 2023-07-01: 4 mg via INTRAVENOUS

## 2023-07-01 MED ORDER — SCOPOLAMINE 1 MG/3DAYS TD PT72
MEDICATED_PATCH | TRANSDERMAL | Status: DC | PRN
  Administered 2023-07-01: 1 via TRANSDERMAL

## 2023-07-01 MED ORDER — ACETAMINOPHEN 10 MG/ML IV SOLN: 1000.0000 mg | Freq: Once | INTRAVENOUS | Status: DC | PRN

## 2023-07-01 MED ORDER — DEXMEDETOMIDINE HCL IN NACL 80 MCG/20ML IV SOLN
INTRAVENOUS | Status: DC | PRN
Start: 2023-07-01 — End: 2023-07-01
  Administered 2023-07-01: 8 ug via INTRAVENOUS
  Administered 2023-07-01: 12 ug via INTRAVENOUS

## 2023-07-01 MED ORDER — 0.9 % SODIUM CHLORIDE (POUR BTL) OPTIME
TOPICAL | Status: DC | PRN
  Administered 2023-07-01: 1000 mL

## 2023-07-01 MED ORDER — FENTANYL CITRATE PF 50 MCG/ML IJ SOSY
PREFILLED_SYRINGE | INTRAMUSCULAR | Status: AC
  Filled 2023-07-01: qty 1

## 2023-07-01 MED ORDER — DEXAMETHASONE SODIUM PHOSPHATE 10 MG/ML IJ SOLN
INTRAMUSCULAR | Status: AC
  Filled 2023-07-01: qty 1

## 2023-07-01 MED ORDER — SODIUM CHLORIDE 0.9 % IV SOLN: INTRAVENOUS | Status: DC | PRN

## 2023-07-01 MED ORDER — CEFAZOLIN SODIUM-DEXTROSE 2-4 GM/100ML-% IV SOLN
2.0000 g | INTRAVENOUS | Status: AC
  Administered 2023-07-01: 2 g via INTRAVENOUS
  Filled 2023-07-01: qty 100

## 2023-07-01 MED ORDER — OXYCODONE HCL 5 MG PO TABS: 5.0000 mg | ORAL_TABLET | Freq: Once | ORAL | Status: DC | PRN

## 2023-07-01 MED ORDER — ORAL CARE MOUTH RINSE: 15.0000 mL | Freq: Once | OROMUCOSAL | Status: AC

## 2023-07-01 MED ORDER — FENTANYL CITRATE PF 50 MCG/ML IJ SOSY
25.0000 ug | PREFILLED_SYRINGE | INTRAMUSCULAR | Status: DC | PRN
  Administered 2023-07-01 (×2): 50 ug via INTRAVENOUS

## 2023-07-01 MED ORDER — DEXAMETHASONE SODIUM PHOSPHATE 10 MG/ML IJ SOLN
INTRAMUSCULAR | Status: DC | PRN
Start: 2023-07-01 — End: 2023-07-01
  Administered 2023-07-01: 10 mg via INTRAVENOUS

## 2023-07-01 MED ORDER — ACETAMINOPHEN 500 MG PO TABS
1000.0000 mg | ORAL_TABLET | Freq: Once | ORAL | Status: AC
  Administered 2023-07-01: 1000 mg via ORAL
  Filled 2023-07-01: qty 2

## 2023-07-01 MED ORDER — SCOPOLAMINE 1 MG/3DAYS TD PT72: 1.0000 | MEDICATED_PATCH | TRANSDERMAL | Status: DC

## 2023-07-01 MED ORDER — LACTATED RINGERS IV SOLN: INTRAVENOUS | Status: DC | PRN

## 2023-07-01 MED ORDER — SUGAMMADEX SODIUM 200 MG/2ML IV SOLN
INTRAVENOUS | Status: DC | PRN
  Administered 2023-07-01: 270 mg via INTRAVENOUS

## 2023-07-01 MED ORDER — OXYCODONE HCL 5 MG/5ML PO SOLN: 5.0000 mg | Freq: Once | ORAL | Status: DC | PRN

## 2023-07-01 MED ORDER — SODIUM CHLORIDE 0.9% FLUSH
3.0000 mL | Freq: Two times a day (BID) | INTRAVENOUS | Status: DC
Start: 2023-07-01 — End: 2023-07-01

## 2023-07-01 MED ORDER — DEXMEDETOMIDINE HCL IN NACL 80 MCG/20ML IV SOLN
INTRAVENOUS | Status: AC
  Filled 2023-07-01: qty 20

## 2023-07-01 MED ORDER — DROPERIDOL 2.5 MG/ML IJ SOLN: 0.6250 mg | Freq: Once | INTRAMUSCULAR | Status: DC | PRN

## 2023-07-01 MED ORDER — EPHEDRINE 5 MG/ML INJ
INTRAVENOUS | Status: AC
  Filled 2023-07-01: qty 5

## 2023-07-01 MED ORDER — SODIUM CHLORIDE 0.9% FLUSH: 3.0000 mL | INTRAVENOUS | Status: DC | PRN

## 2023-07-01 MED ORDER — ROCURONIUM BROMIDE 10 MG/ML (PF) SYRINGE
PREFILLED_SYRINGE | INTRAVENOUS | Status: DC | PRN
  Administered 2023-07-01: 80 mg via INTRAVENOUS
  Administered 2023-07-01: 10 mg via INTRAVENOUS
  Administered 2023-07-01: 20 mg via INTRAVENOUS

## 2023-07-01 MED ORDER — PROPOFOL 500 MG/50ML IV EMUL
INTRAVENOUS | Status: DC | PRN
  Administered 2023-07-01: 150 ug/kg/min via INTRAVENOUS

## 2023-07-01 MED ORDER — CHLORHEXIDINE GLUCONATE 0.12 % MT SOLN
15.0000 mL | Freq: Once | OROMUCOSAL | Status: AC
  Administered 2023-07-01: 15 mL via OROMUCOSAL

## 2023-07-01 MED ORDER — TRAMADOL HCL 50 MG PO TABS: 50.0000 mg | ORAL_TABLET | Freq: Four times a day (QID) | ORAL | 0 refills | Status: AC | PRN

## 2023-07-01 MED ORDER — BUPIVACAINE-EPINEPHRINE (PF) 0.5% -1:200000 IJ SOLN
INTRAMUSCULAR | Status: AC
  Filled 2023-07-01: qty 30

## 2023-07-01 MED ORDER — MIDAZOLAM HCL 2 MG/2ML IJ SOLN
INTRAMUSCULAR | Status: AC
  Filled 2023-07-01: qty 2

## 2023-07-01 MED ORDER — SCOPOLAMINE 1 MG/3DAYS TD PT72
MEDICATED_PATCH | TRANSDERMAL | Status: AC
  Filled 2023-07-01: qty 1

## 2023-07-01 MED ORDER — ONDANSETRON HCL 4 MG/2ML IJ SOLN
INTRAMUSCULAR | Status: AC
  Filled 2023-07-01: qty 2

## 2023-07-01 MED ORDER — ROCURONIUM BROMIDE 10 MG/ML (PF) SYRINGE
PREFILLED_SYRINGE | INTRAVENOUS | Status: AC
  Filled 2023-07-01: qty 10

## 2023-07-01 MED ORDER — PROPOFOL 1000 MG/100ML IV EMUL
INTRAVENOUS | Status: AC
  Filled 2023-07-01: qty 100

## 2023-07-01 MED ORDER — LIDOCAINE HCL (CARDIAC) PF 100 MG/5ML IV SOSY
PREFILLED_SYRINGE | INTRAVENOUS | Status: DC | PRN
  Administered 2023-07-01: 100 mg via INTRAVENOUS

## 2023-07-01 MED ORDER — PROPOFOL 10 MG/ML IV BOLUS
INTRAVENOUS | Status: DC | PRN
  Administered 2023-07-01: 200 mg via INTRAVENOUS

## 2023-07-01 MED ORDER — CHLORHEXIDINE GLUCONATE CLOTH 2 % EX PADS: 6.0000 | MEDICATED_PAD | Freq: Once | CUTANEOUS | Status: DC

## 2023-07-01 MED ORDER — FENTANYL CITRATE (PF) 250 MCG/5ML IJ SOLN
INTRAMUSCULAR | Status: AC
  Filled 2023-07-01: qty 5

## 2023-07-01 MED ORDER — BUPIVACAINE-EPINEPHRINE 0.5% -1:200000 IJ SOLN
INTRAMUSCULAR | Status: DC | PRN
  Administered 2023-07-01: 30 mL

## 2023-07-01 MED ORDER — PHENYLEPHRINE 80 MCG/ML (10ML) SYRINGE FOR IV PUSH (FOR BLOOD PRESSURE SUPPORT)
PREFILLED_SYRINGE | INTRAVENOUS | Status: DC | PRN
Start: 2023-07-01 — End: 2023-07-01
  Administered 2023-07-01 (×3): 160 ug via INTRAVENOUS

## 2023-07-01 MED ORDER — LIDOCAINE HCL (PF) 2 % IJ SOLN
INTRAMUSCULAR | Status: AC
  Filled 2023-07-01: qty 5

## 2023-07-01 MED ORDER — PROPOFOL 10 MG/ML IV BOLUS
INTRAVENOUS | Status: AC
  Filled 2023-07-01: qty 20

## 2023-07-01 MED ORDER — EPHEDRINE SULFATE-NACL 50-0.9 MG/10ML-% IV SOSY
PREFILLED_SYRINGE | INTRAVENOUS | Status: DC | PRN
  Administered 2023-07-01: 5 mg via INTRAVENOUS

## 2023-07-01 MED ORDER — FENTANYL CITRATE (PF) 250 MCG/5ML IJ SOLN
INTRAMUSCULAR | Status: DC | PRN
  Administered 2023-07-01: 50 ug via INTRAVENOUS
  Administered 2023-07-01: 100 ug via INTRAVENOUS
  Administered 2023-07-01 (×2): 50 ug via INTRAVENOUS

## 2023-07-01 SURGICAL SUPPLY — 32 items
APPLIER CLIP ROT 10 11.4 M/L (STAPLE) ×2 IMPLANT
BAG COUNTER SPONGE SURGICOUNT (BAG) IMPLANT
CABLE HIGH FREQUENCY MONO STRZ (ELECTRODE) ×2 IMPLANT
CHLORAPREP W/TINT 26 (MISCELLANEOUS) ×4 IMPLANT
CLIP APPLIE ROT 10 11.4 M/L (STAPLE) ×2 IMPLANT
COVER MAYO STAND XLG (MISCELLANEOUS) ×2 IMPLANT
COVER SURGICAL LIGHT HANDLE (MISCELLANEOUS) ×2 IMPLANT
DERMABOND ADVANCED .7 DNX12 (GAUZE/BANDAGES/DRESSINGS) IMPLANT
DRAPE C-ARM 42X120 X-RAY (DRAPES) ×2 IMPLANT
ELECT REM PT RETURN 15FT ADLT (MISCELLANEOUS) ×2 IMPLANT
GAUZE SPONGE 2X2 8PLY STRL LF (GAUZE/BANDAGES/DRESSINGS) ×2 IMPLANT
GLOVE SURG ORTHO 8.0 STRL STRW (GLOVE) ×2 IMPLANT
GLOVE SURG SYN 7.5 E (GLOVE) ×4 IMPLANT
GLOVE SURG SYN 7.5 PF PI (GLOVE) ×4 IMPLANT
GOWN STRL REUS W/ TWL XL LVL3 (GOWN DISPOSABLE) ×2 IMPLANT
IRRIG SUCT STRYKERFLOW 2 WTIP (MISCELLANEOUS) ×2 IMPLANT
IRRIGATION SUCT STRKRFLW 2 WTP (MISCELLANEOUS) ×2 IMPLANT
KIT BASIN OR (CUSTOM PROCEDURE TRAY) ×2 IMPLANT
KIT TURNOVER KIT A (KITS) IMPLANT
SCISSORS LAP 5X35 DISP (ENDOMECHANICALS) ×2 IMPLANT
SET CHOLANGIOGRAPH MIX (MISCELLANEOUS) ×2 IMPLANT
SET TUBE SMOKE EVAC HIGH FLOW (TUBING) ×2 IMPLANT
SLEEVE Z-THREAD 5X100MM (TROCAR) ×2 IMPLANT
SPIKE FLUID TRANSFER (MISCELLANEOUS) ×2 IMPLANT
SUT MNCRL AB 4-0 PS2 18 (SUTURE) ×2 IMPLANT
SYS BAG RETRIEVAL 10MM (BASKET) ×2 IMPLANT
SYSTEM BAG RETRIEVAL 10MM (BASKET) ×2 IMPLANT
TOWEL OR 17X26 10 PK STRL BLUE (TOWEL DISPOSABLE) ×2 IMPLANT
TRAY LAPAROSCOPIC (CUSTOM PROCEDURE TRAY) ×2 IMPLANT
TROCAR 11X100 Z THREAD (TROCAR) ×2 IMPLANT
TROCAR BALLN 12MMX100 BLUNT (TROCAR) ×2 IMPLANT
TROCAR Z-THREAD OPTICAL 5X100M (TROCAR) ×2 IMPLANT

## 2023-07-01 NOTE — Op Note (Signed)
 Procedure Note  Pre-operative Diagnosis:  chronic cholecystitis, cholelithiasis; right flank lipoma (soft tissue mass)  Post-operative Diagnosis:  same  Surgeon:  Darnell Level, MD  Assistant:  none   Procedure:  1. Laparoscopic cholecystectomy  2. Excision right flank lipoma, 10 x 5 x 3 cm, subcutaneous  Anesthesia:  General  Estimated Blood Loss:  minimal  Drains: none         Specimen: gallbladder to pathology  Indications:  Patient is referred to our practice by Zoe Lan for surgical evaluation and management of symptomatic cholelithiasis and a right flank soft tissue mass likely representing a lipoma. Apparently the patient had previously been evaluated for this. She underwent a ultrasound examination in August 2024. This demonstrated multiple gallstones without any acute inflammatory changes. Also noted was a complex cyst of the left hepatic lobe measuring 3.1 cm. Also noted was a approximately 1 cm cyst in the superior pole of the right kidney. Follow-up ultrasound was recommended in 3 to 6 months. Patient has had intermittent pain in the right flank. On exam she was noted to have a soft tissue mass consistent with a lipoma. Patient presents today to discuss possible gallbladder surgery and concurrent removal of a right flank lipoma. She has intermittent epigastric and right upper quadrant pain associated with meals. She has had no prior abdominal surgery. There is no family history of gallbladder disease.   Procedure description: The patient was seen in the pre-op holding area. The risks, benefits, complications, treatment options, and expected outcomes were previously discussed with the patient. The patient agreed with the proposed plan and has signed the informed consent form.  The patient was transported to operating room #1 at the [facility]. The patient was placed in the supine position on the operating room table. Following induction of general anesthesia, the patient  was turned to a left lateral decubitus position on the beanbag and the right flank was  prepped and draped in the usual aseptic fashion.  The position of the soft tissue mass had previously been marked.  Following a timeout, an incision was made over the mass and carried into the subcutaneous tissues.  Soft tissue mass was identified.  It appears to be a multilobulated lipoma.  It is excised using the electrocautery for hemostasis.  Dissection is carried down to the underlying muscle fascia.  Mass is completely excised.  It measures 10 x 5 x 3 cm in size and is submitted in its entirety to pathology for review.  Good hemostasis is obtained in the subcutaneous tissues.  Subcutaneous tissues are closed with interrupted 3-0 Vicryl sutures.  Skin is closed with a running 4-0 Monocryl subcuticular suture.  Wound is washed and dried and Dermabond is applied as dressing.  Patient is then turned back to a supine position and the underlying beanbag is completely removed.  A second timeout is held indicating that we would proceed with cholecystectomy.  An incision was made in the skin near the umbilicus. The midline fascia was incised and the peritoneal cavity was entered and a Hasson cannula was introduced under direct vision. The cannula was secured with a 0-Vicryl pursestring suture. Pneumoperitoneum was established with carbon dioxide. Additional cannulae were introduced under direct vision along the right costal margin in the midline, mid-clavicular line, and anterior axillary line.   The gallbladder was identified and the fundus grasped and retracted cephalad. Adhesions were taken down bluntly and the electrocautery was utilized as needed, taking care not to involve any adjacent structures.  The infundibulum was grasped and retracted laterally, exposing the peritoneum overlying the triangle of Calot. The peritoneum was incised and structures exposed with blunt dissection. The cystic duct was clearly identified,  bluntly dissected circumferentially.  There appears to be a stone in the cystic duct which is manipulated back and into the gallbladder.  The cystic duct is then clipped at the neck of the gallbladder.  Incision is made in the cystic duct.  Multiple attempts to pass the cholangiogram catheter into the cystic duct are made without success.  Decision is made to discontinue efforts at cholangiography.  The cystic duct was then ligated with ligaclips and divided. The cystic artery was identified, dissected circumferentially, ligated with ligaclips, and divided.  The gallbladder was dissected away from the gallbladder bed using the electrocautery for hemostasis. The gallbladder was completely removed from the liver and placed into an endocatch bag. The gallbladder was removed in the endocatch bag through the umbilical port site and submitted to pathology for review.  The right upper quadrant was irrigated and the gallbladder bed was inspected. Hemostasis was achieved with the electrocautery.  Cannulae were removed under direct vision and good hemostasis was noted. Pneumoperitoneum was released and the majority of the carbon dioxide evacuated. The umbilical wound was irrigated and the fascia was then closed with the pursestring suture.  Local anesthetic was infiltrated at all port sites. Skin incisions were closed with 4-0 Monocril subcuticular sutures and Dermabond was applied.  Instrument, sponge, and needle counts were correct at the conclusion of the case.  The patient was awakened from anesthesia and brought to the recovery room in stable condition.  The patient tolerated the procedure well.   Oralee Billow, MD [facility] Office: [phone]

## 2023-07-01 NOTE — Transfer of Care (Signed)
 Immediate Anesthesia Transfer of Care Note  Patient: Sydney West  Procedure(s) Performed: LAPAROSCOPIC CHOLECYSTECTOMY EXCISION RIGHT FLANK LIPOMA (Right)  Patient Location: PACU  Anesthesia Type:General  Level of Consciousness: awake  Airway & Oxygen Therapy: Patient Spontanous Breathing and Patient connected to face mask oxygen  Post-op Assessment: Report given to RN and Post -op Vital signs reviewed and stable  Post vital signs: Reviewed and stable  Last Vitals:  Vitals Value Taken Time  BP    Temp    Pulse 86 07/01/23 0936  Resp 12 07/01/23 0936  SpO2 95 % 07/01/23 0936  Vitals shown include unfiled device data.  Last Pain:  Vitals:   07/01/23 0603  TempSrc: Oral         Complications: No notable events documented.

## 2023-07-01 NOTE — Interval H&P Note (Signed)
 History and Physical Interval Note:  07/01/2023 7:05 AM  Sydney West  has presented today for surgery, with the diagnosis of chronic cholecystitis, cholelithiasis, right flank lipoma.  The various methods of treatment have been discussed with the patient and family. After consideration of risks, benefits and other options for treatment, the patient has consented to    Procedure(s): LAPAROSCOPIC CHOLECYSTECTOMY WITH INTRAOPERATIVE CHOLANGIOGRAM (N/A) EXCISION RIGHT FLANK LIPOMA (Right) as a surgical intervention.    The patient's history has been reviewed, patient examined, no change in status, stable for surgery.  I have reviewed the patient's chart and labs.  Questions were answered to the patient's satisfaction.    Oralee Billow, MD [facility] Office: [phone]   Oralee Billow

## 2023-07-01 NOTE — Anesthesia Postprocedure Evaluation (Signed)
 Anesthesia Post Note  Patient: Sydney West  Procedure(s) Performed: LAPAROSCOPIC CHOLECYSTECTOMY EXCISION RIGHT FLANK LIPOMA (Right)     Patient location during evaluation: PACU Anesthesia Type: General Level of consciousness: awake and alert Pain management: pain level controlled Vital Signs Assessment: post-procedure vital signs reviewed and stable Respiratory status: spontaneous breathing, nonlabored ventilation, respiratory function stable and patient connected to nasal cannula oxygen Cardiovascular status: blood pressure returned to baseline and stable Postop Assessment: no apparent nausea or vomiting Anesthetic complications: no   No notable events documented.  Last Vitals:  Vitals:   07/01/23 1045 07/01/23 1100  BP: 125/79 125/68  Pulse: 67 81  Resp: 11 19  Temp:    SpO2: 97% 98%    Last Pain:  Vitals:   07/01/23 1100  TempSrc:   PainSc: 3                  Lethaniel Rave

## 2023-07-01 NOTE — Anesthesia Procedure Notes (Signed)
 Procedure Name: Intubation Date/Time: 07/01/2023 7:37 AM  Performed by: Vella Gey, CRNAPre-anesthesia Checklist: Patient identified, Emergency Drugs available, Suction available and Patient being monitored Patient Re-evaluated:Patient Re-evaluated prior to induction Oxygen Delivery Method: Circle system utilized Preoxygenation: Pre-oxygenation with 100% oxygen Induction Type: IV induction Ventilation: Mask ventilation without difficulty Laryngoscope Size: Miller and 2 Grade View: Grade I Tube type: Oral Tube size: 7.5 mm Number of attempts: 1 Airway Equipment and Method: Stylet and Oral airway Placement Confirmation: ETT inserted through vocal cords under direct vision, positive ETCO2 and breath sounds checked- equal and bilateral Secured at: 22 cm Tube secured with: Tape Dental Injury: Teeth and Oropharynx as per pre-operative assessment

## 2023-07-01 NOTE — Discharge Instructions (Addendum)
 [facility]  LAPAROSCOPIC SURGERY:  POST-OP INSTRUCTIONS  Always review your discharge instruction sheet given to you by the facility where your surgery was performed.  A prescription for pain medication may be given to you upon discharge.  Take your pain medication as prescribed.  If narcotic pain medicine is not needed, then you may take acetaminophen (Tylenol) or ibuprofen (Advil) as needed.  Take your usually prescribed medications unless otherwise directed.  If you need a refill on your pain medication, please contact your pharmacy.  They will contact our office to request authorization. Prescriptions will not be filled after 5 P.M. or on weekends.  You should follow a light diet the first few days after arrival home, such as soup and crackers or toast.  Be sure to include plenty of fluids daily.  Most patients will experience some swelling and bruising in the area of the incisions.  Ice packs will help.  Swelling and bruising can take several days to resolve.   It is common to experience some constipation after surgery.  Increasing fluid intake and taking a stool softener (such as Colace) will usually help or prevent this problem from occurring.  A mild laxative (Milk of Magnesia or Miralax) should be taken according to package instructions if there has been no bowel movement after 48 hours.  You will likely have Dermabond (topical glue) over your incisions.  This seals the incisions and allows you to bathe and shower at any time after your surgery.  Glue should remain in place for up to 10 days.  It may be removed after 10 days by pealing off the Dermabond material or using Vaseline or naval jelly to remove.  If you have steri-strips over your incisions, you may remove the gauze bandage on the second day after surgery, and you may shower at that time.  Leave your steri-strips (small skin tapes) in place directly over the incision.  These strips should remain on the  skin for 5-7 days and then be removed.  You may get them wet in the shower and pat them dry.  Any sutures or staples will be removed at the office during your follow-up visit.  ACTIVITIES:  You may resume regular (light) daily activities beginning the next day - such as daily self-care, walking, climbing stairs - gradually increasing activities as tolerated.  You may have sexual intercourse when it is comfortable.  Refrain from any heavy lifting or straining until approved by your doctor.  You may drive when you are no longer taking prescription pain medication, when you can comfortably wear a seatbelt, and when you can safely maneuver your car and apply brakes.  You should see your doctor in the office for a follow-up appointment approximately 2-3 weeks after your surgery.  Make sure that you call for this appointment within a day or two after you arrive home to insure a convenient appointment time.  WHEN TO CALL YOUR DOCTOR: Fever over 101.0 Inability to urinate Continued bleeding from incision Increased pain, redness, or drainage from the incision Increasing abdominal pain  The clinic staff is available to answer your questions during regular business hours.  Please don't hesitate to call and ask to speak to one of the nurses for clinical concerns.  If you have a medical emergency, go to the nearest emergency room or call 911.  A surgeon from [facility] is always on call for the hospital.  Darnell Level, MD [facility] Office: [phone] Toll Free:  [phone] FAX [phone]  Website: [email or web address]

## 2023-07-02 ENCOUNTER — Encounter (HOSPITAL_COMMUNITY): Payer: Self-pay | Admitting: Surgery

## 2023-07-02 LAB — SURGICAL PATHOLOGY
